# Patient Record
Sex: Female | Born: 1985 | Race: White | Hispanic: No | Marital: Married | Smoking: Never smoker
Health system: Southern US, Community
[De-identification: ages and names within clinical notes are randomized; demographics above are authoritative.]

## PROBLEM LIST (undated history)

## (undated) DIAGNOSIS — F32A Depression, unspecified: Secondary | ICD-10-CM

## (undated) DIAGNOSIS — F419 Anxiety disorder, unspecified: Secondary | ICD-10-CM

## (undated) DIAGNOSIS — D649 Anemia, unspecified: Secondary | ICD-10-CM

## (undated) DIAGNOSIS — R87629 Unspecified abnormal cytological findings in specimens from vagina: Secondary | ICD-10-CM

## (undated) DIAGNOSIS — E282 Polycystic ovarian syndrome: Secondary | ICD-10-CM

## (undated) DIAGNOSIS — N83209 Unspecified ovarian cyst, unspecified side: Secondary | ICD-10-CM

## (undated) DIAGNOSIS — N760 Acute vaginitis: Secondary | ICD-10-CM

## (undated) HISTORY — DX: Polycystic ovarian syndrome: E28.2

## (undated) HISTORY — DX: Unspecified abnormal cytological findings in specimens from vagina: R87.629

## (undated) HISTORY — PX: WISDOM TOOTH EXTRACTION: SHX21

## (undated) HISTORY — DX: Unspecified ovarian cyst, unspecified side: N83.209

---

## 2009-08-13 ENCOUNTER — Emergency Department (HOSPITAL_COMMUNITY): Admission: EM | Admit: 2009-08-13 | Discharge: 2009-08-13 | Payer: Self-pay | Admitting: Emergency Medicine

## 2009-12-23 ENCOUNTER — Emergency Department (HOSPITAL_COMMUNITY): Admission: EM | Admit: 2009-12-23 | Discharge: 2009-12-23 | Payer: Self-pay | Admitting: Emergency Medicine

## 2010-09-18 LAB — URINALYSIS, ROUTINE W REFLEX MICROSCOPIC
Glucose, UA: NEGATIVE mg/dL
Leukocytes, UA: NEGATIVE
Nitrite: NEGATIVE
Protein, ur: NEGATIVE mg/dL
Urobilinogen, UA: 0.2 mg/dL (ref 0.0–1.0)

## 2010-09-18 LAB — CBC
Hemoglobin: 12.8 g/dL (ref 12.0–15.0)
MCH: 31 pg (ref 26.0–34.0)
MCV: 91.6 fL (ref 78.0–100.0)
Platelets: 186 10*3/uL (ref 150–400)

## 2010-09-18 LAB — WET PREP, GENITAL
Trich, Wet Prep: NONE SEEN
Yeast Wet Prep HPF POC: NONE SEEN

## 2010-09-18 LAB — POCT PREGNANCY, URINE: Preg Test, Ur: NEGATIVE

## 2010-09-18 LAB — RPR: RPR Ser Ql: NONREACTIVE

## 2010-09-18 LAB — COMPREHENSIVE METABOLIC PANEL
AST: 18 U/L (ref 0–37)
Albumin: 3.5 g/dL (ref 3.5–5.2)
Alkaline Phosphatase: 62 U/L (ref 39–117)
BUN: 6 mg/dL (ref 6–23)
Calcium: 9 mg/dL (ref 8.4–10.5)
Creatinine, Ser: 0.8 mg/dL (ref 0.4–1.2)
GFR calc non Af Amer: >60 mL/min (ref >60)
Glucose, Bld: 92 mg/dL (ref 70–99)
Potassium: 3.7 mEq/L (ref 3.5–5.1)

## 2010-09-18 LAB — DIFFERENTIAL
Basophils Relative: 0 % (ref 0–1)
Eosinophils Absolute: 0 10*3/uL (ref 0.0–0.7)
Lymphocytes Relative: 16 % (ref 12–46)
Lymphs Abs: 1.1 10*3/uL (ref 0.7–4.0)
Monocytes Absolute: 0.5 10*3/uL (ref 0.1–1.0)
Monocytes Relative: 7 % (ref 3–12)
Neutrophils Relative %: 78 % — ABNORMAL HIGH (ref 43–77)

## 2010-09-18 LAB — URINE CULTURE

## 2010-09-18 LAB — URINE MICROSCOPIC-ADD ON

## 2010-09-18 LAB — GC/CHLAMYDIA PROBE AMP, GENITAL: Chlamydia, DNA Probe: NEGATIVE

## 2011-04-02 ENCOUNTER — Encounter (HOSPITAL_COMMUNITY): Payer: Self-pay | Admitting: *Deleted

## 2011-04-02 ENCOUNTER — Inpatient Hospital Stay (HOSPITAL_COMMUNITY)
Admission: AD | Admit: 2011-04-02 | Discharge: 2011-04-02 | Disposition: A | Discharge status: Home / Self Care | Payer: PRIVATE HEALTH INSURANCE | Source: Ambulatory Visit | Attending: Obstetrics and Gynecology | Admitting: Obstetrics and Gynecology

## 2011-04-02 DIAGNOSIS — R300 Dysuria: Secondary | ICD-10-CM | POA: Insufficient documentation

## 2011-04-02 DIAGNOSIS — R319 Hematuria, unspecified: Secondary | ICD-10-CM | POA: Insufficient documentation

## 2011-04-02 HISTORY — DX: Acute vaginitis: N76.0

## 2011-04-02 LAB — URINALYSIS, ROUTINE W REFLEX MICROSCOPIC
Glucose, UA: NEGATIVE mg/dL
Ketones, ur: NEGATIVE mg/dL
Nitrite: POSITIVE — AB
Protein, ur: 100 mg/dL — AB
Specific Gravity, Urine: 1.02 (ref 1.005–1.030)
Urobilinogen, UA: 0.2 mg/dL (ref 0.0–1.0)
pH: 6.5 (ref 5.0–8.0)

## 2011-04-02 LAB — URINE MICROSCOPIC-ADD ON

## 2011-04-02 MED ORDER — CIPROFLOXACIN HCL 250 MG PO TABS: 250.0000 mg | ORAL_TABLET | Freq: Two times a day (BID) | ORAL | Status: AC

## 2011-04-02 MED ORDER — PHENAZOPYRIDINE HCL 200 MG PO TABS: 200.0000 mg | ORAL_TABLET | Freq: Three times a day (TID) | ORAL | Status: AC

## 2011-04-02 NOTE — Progress Notes (Signed)
Dr. Billy Coast at bedside.  Assessment done and poc discussed with pt.

## 2011-04-02 NOTE — Progress Notes (Signed)
Pt presents to [facility] for c/o  Blood in urine.  Started this afternoon and became worse around 7pm.  C/o vaginal pain and pain in bladder with burning during urination.

## 2011-04-02 NOTE — Progress Notes (Signed)
Dr. Billy Coast notified of pt. Presenting for blood in urine.  States pt was supposed to go to lab for urinalysis and not sign in to [facility].  Will notify charge nurse.  MD states he will not be able to see pt for a couple of hours.

## 2011-04-02 NOTE — ED Provider Notes (Signed)
History   Dysuria  Chief Complaint  Patient presents with  . Hematuria   HPI25 yo wf non pregnant with dysuria. No fever , chills, nausea or vomiting. No vaginal bleeding. No BC. No flank pain. No history of urolithiasis.  Pertinent Gynecological History: Menses: flow is moderate, irregular occurring approximately every 28 days with spotting approximately 0 days per month, usually lasting less than 6 days and with minimal cramping Bleeding: normal Contraception: none DES exposure: denies Blood transfusions: none Sexually transmitted diseases: no past history Previous GYN Procedures: none  Last mammogram: na Date: na Last pap: normal Date: na   Past Medical History  Diagnosis Date  . Vaginal vestibulitis     Past Surgical History  Procedure Date  . Wisdom tooth extraction     No family history on file.  History  Substance Use Topics  . Smoking status: Never Smoker   . Smokeless tobacco: Not on file  . Alcohol Use: Yes    Allergies: No Known Allergies  Prescriptions prior to admission  Medication Sig Dispense Refill  . amitriptyline (ELAVIL) 10 MG tablet Take 10 mg by mouth at bedtime.        Marland Kitchen ibuprofen (ADVIL,MOTRIN) 200 MG tablet Take 200 mg by mouth every 6 (six) hours as needed. For pain         ROS Otherwise negative Physical Exam WDWN WF NAD HEENT-nl Neck- supple Chest: CTA CV: RRR ABD: soft NT No CVAT or flank pain Pelvic ; deferred Ext: Neg C/c/e Neuro: nonfocal Skin: intact   Blood pressure 126/83, pulse 96, temperature 98.2 F (36.8 C), temperature source Oral, resp. rate 20, height 5\' 4"  (1.626 m), weight 74.9 kg (165 lb 2 oz), last menstrual period 02/03/2011.  Physical Exam  MAU Course  Procedures  MDM na  Assessment and Plan  Dysuria with pos UA  Cipro 250 mg bid x 7d Pyridium 200mg  tid x 2d Pyelo precautions.  Jyren Cerasoli J 04/02/2011, 10:25 PM [facility]

## 2011-04-02 NOTE — ED Provider Notes (Signed)
Rachel [name] is a 25 y.o. female who presents to [facility]U for blood in urine. Symptoms started at 3 pm and have gotten worse.  Abdomen is soft with suprapubic tenderness. No CVA tenderness. Patient is afebrile and stable to await Dr. Jorene Minors arrival.  Kerrie Buffalo, NP 04/02/11 2205

## 2015-06-02 LAB — OB RESULTS CONSOLE ANTIBODY SCREEN: Antibody Screen: NEGATIVE

## 2015-06-02 LAB — OB RESULTS CONSOLE HIV ANTIBODY (ROUTINE TESTING): HIV: NONREACTIVE

## 2015-06-02 LAB — OB RESULTS CONSOLE GC/CHLAMYDIA
Chlamydia: NEGATIVE
Gonorrhea: NEGATIVE

## 2015-06-02 LAB — OB RESULTS CONSOLE RUBELLA ANTIBODY, IGM: Rubella: IMMUNE

## 2015-06-02 LAB — OB RESULTS CONSOLE HEPATITIS B SURFACE ANTIGEN: Hepatitis B Surface Ag: NEGATIVE

## 2015-06-02 LAB — OB RESULTS CONSOLE ABO/RH: RH TYPE: POSITIVE

## 2015-06-02 LAB — OB RESULTS CONSOLE RPR: RPR: NONREACTIVE

## 2015-12-09 LAB — OB RESULTS CONSOLE GBS: STREP GROUP B AG: NEGATIVE

## 2016-01-08 ENCOUNTER — Inpatient Hospital Stay (HOSPITAL_COMMUNITY)
Admission: AD | Admit: 2016-01-08 | Payer: PRIVATE HEALTH INSURANCE | Source: Ambulatory Visit | Admitting: Obstetrics and Gynecology

## 2016-01-11 ENCOUNTER — Encounter (HOSPITAL_COMMUNITY): Payer: Self-pay | Admitting: *Deleted

## 2016-01-11 ENCOUNTER — Telehealth (HOSPITAL_COMMUNITY): Payer: Self-pay | Admitting: *Deleted

## 2016-01-11 NOTE — Telephone Encounter (Signed)
Preadmission screen  

## 2016-01-12 ENCOUNTER — Other Ambulatory Visit: Payer: Self-pay | Admitting: Obstetrics & Gynecology

## 2016-01-17 ENCOUNTER — Inpatient Hospital Stay (HOSPITAL_COMMUNITY)
Admission: RE | Admit: 2016-01-17 | Discharge: 2016-01-20 | DRG: 765 | Disposition: A | Discharge status: Home / Self Care | Payer: Managed Care, Other (non HMO) | Source: Ambulatory Visit | Attending: Obstetrics & Gynecology | Admitting: Obstetrics & Gynecology

## 2016-01-17 ENCOUNTER — Inpatient Hospital Stay (HOSPITAL_COMMUNITY): Payer: Managed Care, Other (non HMO) | Admitting: Anesthesiology

## 2016-01-17 ENCOUNTER — Inpatient Hospital Stay (HOSPITAL_COMMUNITY): Admission: RE | Admit: 2016-01-17 | Payer: PRIVATE HEALTH INSURANCE | Source: Ambulatory Visit

## 2016-01-17 DIAGNOSIS — Z3490 Encounter for supervision of normal pregnancy, unspecified, unspecified trimester: Secondary | ICD-10-CM

## 2016-01-17 DIAGNOSIS — Z3A41 41 weeks gestation of pregnancy: Secondary | ICD-10-CM

## 2016-01-17 DIAGNOSIS — Z9889 Other specified postprocedural states: Secondary | ICD-10-CM

## 2016-01-17 DIAGNOSIS — D62 Acute posthemorrhagic anemia: Secondary | ICD-10-CM | POA: Diagnosis not present

## 2016-01-17 DIAGNOSIS — O9081 Anemia of the puerperium: Secondary | ICD-10-CM | POA: Diagnosis not present

## 2016-01-17 DIAGNOSIS — O480 Post-term pregnancy: Secondary | ICD-10-CM | POA: Diagnosis present

## 2016-01-17 LAB — CBC
HEMATOCRIT: 32 % — AB (ref 36.0–46.0)
HEMOGLOBIN: 11.2 g/dL — AB (ref 12.0–15.0)
MCH: 31.5 pg (ref 26.0–34.0)
MCHC: 35 g/dL (ref 30.0–36.0)
MCV: 89.9 fL (ref 78.0–100.0)
PLATELETS: 152 10*3/uL (ref 150–400)
RBC: 3.56 MIL/uL — AB (ref 3.87–5.11)
RDW: 13.5 % (ref 11.5–15.5)
WBC: 8.3 10*3/uL (ref 4.0–10.5)

## 2016-01-17 LAB — TYPE AND SCREEN
ABO/RH(D): B POS
Antibody Screen: NEGATIVE

## 2016-01-17 LAB — ABO/RH: ABO/RH(D): B POS

## 2016-01-17 MED ORDER — TERBUTALINE SULFATE 1 MG/ML IJ SOLN: 0.2500 mg | Freq: Once | INTRAMUSCULAR | Status: DC | PRN

## 2016-01-17 MED ORDER — EPHEDRINE 5 MG/ML INJ: 10.0000 mg | INTRAVENOUS | Status: DC | PRN

## 2016-01-17 MED ORDER — LACTATED RINGERS IV SOLN: 500.0000 mL | INTRAVENOUS | Status: DC | PRN

## 2016-01-17 MED ORDER — LACTATED RINGERS IV SOLN: 500.0000 mL | Freq: Once | INTRAVENOUS | Status: DC

## 2016-01-17 MED ORDER — SOD CITRATE-CITRIC ACID 500-334 MG/5ML PO SOLN
30.0000 mL | ORAL | Status: DC | PRN
  Administered 2016-01-18: 30 mL via ORAL
  Filled 2016-01-17: qty 15

## 2016-01-17 MED ORDER — FLEET ENEMA 7-19 GM/118ML RE ENEM: 1.0000 | ENEMA | RECTAL | Status: DC | PRN

## 2016-01-17 MED ORDER — LACTATED RINGERS IV SOLN
INTRAVENOUS | Status: DC
  Administered 2016-01-17 (×3): via INTRAVENOUS

## 2016-01-17 MED ORDER — ONDANSETRON HCL 4 MG/2ML IJ SOLN
4.0000 mg | Freq: Four times a day (QID) | INTRAMUSCULAR | Status: DC | PRN
  Administered 2016-01-17: 4 mg via INTRAVENOUS
  Filled 2016-01-17: qty 2

## 2016-01-17 MED ORDER — PHENYLEPHRINE 40 MCG/ML (10ML) SYRINGE FOR IV PUSH (FOR BLOOD PRESSURE SUPPORT)
80.0000 ug | PREFILLED_SYRINGE | INTRAVENOUS | Status: DC | PRN
  Filled 2016-01-17: qty 10

## 2016-01-17 MED ORDER — FENTANYL 2.5 MCG/ML BUPIVACAINE 1/10 % EPIDURAL INFUSION (WH - ANES)
14.0000 mL/h | INTRAMUSCULAR | Status: DC | PRN
  Administered 2016-01-17 (×2): 14 mL/h via EPIDURAL
  Filled 2016-01-17 (×3): qty 125

## 2016-01-17 MED ORDER — OXYTOCIN 40 UNITS IN LACTATED RINGERS INFUSION - SIMPLE MED
1.0000 m[IU]/min | INTRAVENOUS | Status: DC
  Administered 2016-01-17: 2 m[IU]/min via INTRAVENOUS

## 2016-01-17 MED ORDER — MISOPROSTOL 25 MCG QUARTER TABLET
25.0000 ug | ORAL_TABLET | ORAL | Status: DC | PRN
  Administered 2016-01-17 (×2): 25 ug via VAGINAL
  Filled 2016-01-17 (×3): qty 0.25

## 2016-01-17 MED ORDER — OXYCODONE-ACETAMINOPHEN 5-325 MG PO TABS: 1.0000 | ORAL_TABLET | ORAL | Status: DC | PRN

## 2016-01-17 MED ORDER — LIDOCAINE HCL (PF) 1 % IJ SOLN: 30.0000 mL | INTRAMUSCULAR | Status: DC | PRN

## 2016-01-17 MED ORDER — OXYTOCIN BOLUS FROM INFUSION: 500.0000 mL | INTRAVENOUS | Status: DC

## 2016-01-17 MED ORDER — OXYTOCIN 40 UNITS IN LACTATED RINGERS INFUSION - SIMPLE MED
2.5000 [IU]/h | INTRAVENOUS | Status: DC
  Filled 2016-01-17: qty 1000

## 2016-01-17 MED ORDER — DIPHENHYDRAMINE HCL 50 MG/ML IJ SOLN: 12.5000 mg | INTRAMUSCULAR | Status: DC | PRN

## 2016-01-17 MED ORDER — OXYCODONE-ACETAMINOPHEN 5-325 MG PO TABS: 2.0000 | ORAL_TABLET | ORAL | Status: DC | PRN

## 2016-01-17 MED ORDER — PHENYLEPHRINE 40 MCG/ML (10ML) SYRINGE FOR IV PUSH (FOR BLOOD PRESSURE SUPPORT)
80.0000 ug | PREFILLED_SYRINGE | INTRAVENOUS | Status: DC | PRN
  Administered 2016-01-17: 80 ug via INTRAVENOUS

## 2016-01-17 MED ORDER — LIDOCAINE HCL (PF) 1 % IJ SOLN
INTRAMUSCULAR | Status: DC | PRN
  Administered 2016-01-17: 6 mL via EPIDURAL
  Administered 2016-01-17: 4 mL

## 2016-01-17 MED ORDER — ACETAMINOPHEN 325 MG PO TABS: 650.0000 mg | ORAL_TABLET | ORAL | Status: DC | PRN

## 2016-01-17 NOTE — Anesthesia Procedure Notes (Signed)

## 2016-01-17 NOTE — Anesthesia Pain Management Evaluation Note (Signed)
  CRNA Pain Management Visit Note  Patient: Rachel [name], 30 y.o., female  "Hello I am a member of the anesthesia team at [facility]. We have an anesthesia team available at all times to provide care throughout the hospital, including epidural management and anesthesia for C-section. I don't know your plan for the delivery whether it a natural birth, water birth, IV sedation, nitrous supplementation, doula or epidural, but we want to meet your pain goals."   1.Was your pain managed to your expectations on prior hospitalizations?   No prior hospitalizations  2.What is your expectation for pain management during this hospitalization?     Epidural  3.How can we help you reach that goal? epidural  Record the patient's initial score and the patient's pain goal.   Pain: 3  Pain Goal: 5 [facility] wants you to be able to say your pain was always managed very well.  Daanish Copes 01/17/2016

## 2016-01-17 NOTE — H&P (Signed)
Rachel [name] is a 30 y.o. female G2P0010 581w2d presenting for Induction Postdates.  HPI/HPP:  Normal pregnancy, but post dates.  Induction Cytotec/Pitocin.  Good FMs, no AF leak, no reg UCs, no vaginal bleeding, no PEC Sx.  GBS neg.  OB History    Gravida Para Term Preterm AB TAB SAB Ectopic Multiple Living   2    1  1         Past Medical History  Diagnosis Date  . Vaginal vestibulitis   . Vaginal Pap smear, abnormal   . PCOS (polycystic ovarian syndrome)    Past Surgical History  Procedure Laterality Date  . Wisdom tooth extraction     Family History: family history includes Diabetes in her paternal grandmother; Hypertension in her father. Social History:  reports that she has never smoked. She has never used smokeless tobacco. She reports that she drinks alcohol. She reports that she does not use illicit drugs.  No Known Allergies  Dilation: 4 Effacement (%): 90 Station: -2 Exam by:: m wilkins rnc   Blood pressure 136/83, pulse 98, temperature 98.7 F (37.1 C), temperature source Oral, resp. rate 20, height 5\' 4"  (1.626 m), weight 207 lb (93.895 kg), SpO2 100 %. Exam Physical Exam   FHR monitoring Base line 120's with good variability and accelerations.  No deceleration.  HPP:  Patient Active Problem List   Diagnosis Date Noted  . Pregnancy 01/17/2016    Prenatal labs: ABO, Rh: --/--/B POS (07/17 0055) Antibody: NEG (07/17 0050) Rubella: Immune RPR: Nonreactive (11/30 0000)  HBsAg: Negative (11/30 0000)  HIV: Non-reactive (11/30 0000)  Genetic testing: Declined.  AFP1 neg US anato: wnl, normal placenta.  Female fetus. 1 hr GTT: Abnormal.  3 hr GTT wnl GBS: Negative (06/08 0000)   Assessment/Plan: 41 2/7 wks for Induction postdates.  Cytotec, Pitocin.  Entering active labor.  FHR cat 1.  Well under epidural.   Jeromie Gainor,MARIE-LYNE 01/17/2016, 4:04 PM

## 2016-01-17 NOTE — Anesthesia Preprocedure Evaluation (Addendum)
Anesthesia Evaluation  Patient identified by MRN, date of birth, ID band Patient awake    Reviewed: Allergy & Precautions, H&P , Patient's Chart, lab work & pertinent test results  Airway Mallampati: II  TM Distance: >3 FB Neck ROM: full    Dental  (+) Teeth Intact   Pulmonary neg pulmonary ROS,    breath sounds clear to auscultation       Cardiovascular negative cardio ROS   Rhythm:regular Rate:Normal     Neuro/Psych negative neurological ROS  negative psych ROS   GI/Hepatic negative GI ROS, Neg liver ROS,   Endo/Other  negative endocrine ROS  Renal/GU negative Renal ROS  negative genitourinary   Musculoskeletal negative musculoskeletal ROS (+)   Abdominal   Peds negative pediatric ROS (+)  Hematology negative hematology ROS (+)   Anesthesia Other Findings       Reproductive/Obstetrics (+) Pregnancy                            Lab Results  Component Value Date   WBC 8.3 01/17/2016   HGB 11.2* 01/17/2016   HCT 32.0* 01/17/2016   MCV 89.9 01/17/2016   PLT 152 01/17/2016     Anesthesia Physical Anesthesia Plan  ASA: II  Anesthesia Plan: Epidural   Post-op Pain Management:    Induction:   Airway Management Planned:   Additional Equipment:   Intra-op Plan:   Post-operative Plan:   Informed Consent: I have reviewed the patients History and Physical, chart, labs and discussed the procedure including the risks, benefits and alternatives for the proposed anesthesia with the patient or authorized representative who has indicated his/her understanding and acceptance.   Dental Advisory Given  Plan Discussed with:   Anesthesia Plan Comments: (Labs checked- platelets confirmed with RN in room. Fetal heart tracing, per RN, reported to be stable enough for sitting procedure. Discussed epidural, and patient consents to the procedure:  included risk of possible  headache,backache, failed block, allergic reaction, and nerve injury. This patient was asked if she had any questions or concerns before the procedure started.)        Anesthesia Quick Evaluation

## 2016-01-18 ENCOUNTER — Encounter (HOSPITAL_COMMUNITY)
Admission: RE | Disposition: A | Discharge status: Home / Self Care | Payer: Self-pay | Source: Ambulatory Visit | Attending: Obstetrics & Gynecology

## 2016-01-18 ENCOUNTER — Encounter (HOSPITAL_COMMUNITY): Payer: Self-pay

## 2016-01-18 DIAGNOSIS — Z9889 Other specified postprocedural states: Secondary | ICD-10-CM

## 2016-01-18 LAB — CBC
HEMATOCRIT: 27.7 % — AB (ref 36.0–46.0)
HEMOGLOBIN: 9.5 g/dL — AB (ref 12.0–15.0)
MCH: 31.1 pg (ref 26.0–34.0)
MCHC: 34.3 g/dL (ref 30.0–36.0)
MCV: 90.8 fL (ref 78.0–100.0)
Platelets: 126 10*3/uL — ABNORMAL LOW (ref 150–400)
RBC: 3.05 MIL/uL — AB (ref 3.87–5.11)
RDW: 13.7 % (ref 11.5–15.5)
WBC: 17.8 10*3/uL — ABNORMAL HIGH (ref 4.0–10.5)

## 2016-01-18 LAB — RPR: RPR Ser Ql: NONREACTIVE

## 2016-01-18 SURGERY — Surgical Case
Anesthesia: Epidural

## 2016-01-18 MED ORDER — LIDOCAINE-EPINEPHRINE (PF) 2 %-1:200000 IJ SOLN
INTRAMUSCULAR | Status: AC
  Filled 2016-01-18: qty 20

## 2016-01-18 MED ORDER — NALOXONE HCL 2 MG/2ML IJ SOSY
1.0000 ug/kg/h | PREFILLED_SYRINGE | INTRAVENOUS | Status: DC | PRN
  Filled 2016-01-18: qty 2

## 2016-01-18 MED ORDER — KETOROLAC TROMETHAMINE 30 MG/ML IJ SOLN
INTRAMUSCULAR | Status: DC | PRN
  Administered 2016-01-18: 30 mg via INTRAVENOUS

## 2016-01-18 MED ORDER — SIMETHICONE 80 MG PO CHEW
80.0000 mg | CHEWABLE_TABLET | ORAL | Status: DC
  Administered 2016-01-18 – 2016-01-19 (×2): 80 mg via ORAL
  Filled 2016-01-18 (×2): qty 1

## 2016-01-18 MED ORDER — MEPERIDINE HCL 25 MG/ML IJ SOLN: 6.2500 mg | INTRAMUSCULAR | Status: DC | PRN

## 2016-01-18 MED ORDER — DIPHENHYDRAMINE HCL 50 MG/ML IJ SOLN: 12.5000 mg | INTRAMUSCULAR | Status: DC | PRN

## 2016-01-18 MED ORDER — COCONUT OIL OIL: 1.0000 "application " | TOPICAL_OIL | Status: DC | PRN

## 2016-01-18 MED ORDER — IBUPROFEN 600 MG PO TABS
600.0000 mg | ORAL_TABLET | Freq: Four times a day (QID) | ORAL | Status: DC
  Administered 2016-01-18 – 2016-01-20 (×9): 600 mg via ORAL
  Filled 2016-01-18 (×9): qty 1

## 2016-01-18 MED ORDER — TETANUS-DIPHTH-ACELL PERTUSSIS 5-2.5-18.5 LF-MCG/0.5 IM SUSP: 0.5000 mL | Freq: Once | INTRAMUSCULAR | Status: DC

## 2016-01-18 MED ORDER — LACTATED RINGERS IV SOLN
INTRAVENOUS | Status: DC | PRN
  Administered 2016-01-18: 03:00:00 via INTRAVENOUS

## 2016-01-18 MED ORDER — PRENATAL MULTIVITAMIN CH
1.0000 | ORAL_TABLET | Freq: Every day | ORAL | Status: DC
  Administered 2016-01-20: 1 via ORAL
  Filled 2016-01-18 (×2): qty 1

## 2016-01-18 MED ORDER — OXYTOCIN 40 UNITS IN LACTATED RINGERS INFUSION - SIMPLE MED: 2.5000 [IU]/h | INTRAVENOUS | Status: AC

## 2016-01-18 MED ORDER — LACTATED RINGERS IV SOLN
INTRAVENOUS | Status: DC | PRN
  Administered 2016-01-18 (×2): via INTRAVENOUS

## 2016-01-18 MED ORDER — FENTANYL CITRATE (PF) 100 MCG/2ML IJ SOLN
25.0000 ug | INTRAMUSCULAR | Status: DC | PRN
Start: 2016-01-18 — End: 2016-01-18

## 2016-01-18 MED ORDER — DIPHENHYDRAMINE HCL 25 MG PO CAPS: 25.0000 mg | ORAL_CAPSULE | Freq: Four times a day (QID) | ORAL | Status: DC | PRN

## 2016-01-18 MED ORDER — NALBUPHINE HCL 10 MG/ML IJ SOLN: 5.0000 mg | INTRAMUSCULAR | Status: DC | PRN

## 2016-01-18 MED ORDER — NALBUPHINE HCL 10 MG/ML IJ SOLN: 5.0000 mg | Freq: Once | INTRAMUSCULAR | Status: DC | PRN

## 2016-01-18 MED ORDER — DIPHENHYDRAMINE HCL 25 MG PO CAPS
25.0000 mg | ORAL_CAPSULE | ORAL | Status: DC | PRN
  Filled 2016-01-18: qty 1

## 2016-01-18 MED ORDER — MORPHINE SULFATE (PF) 0.5 MG/ML IJ SOLN
INTRAMUSCULAR | Status: AC
  Filled 2016-01-18: qty 10

## 2016-01-18 MED ORDER — SENNOSIDES-DOCUSATE SODIUM 8.6-50 MG PO TABS
2.0000 | ORAL_TABLET | ORAL | Status: DC
  Administered 2016-01-18 – 2016-01-19 (×2): 2 via ORAL
  Filled 2016-01-18 (×2): qty 2

## 2016-01-18 MED ORDER — SODIUM BICARBONATE 8.4 % IV SOLN
INTRAVENOUS | Status: DC | PRN
  Administered 2016-01-18 (×3): 5 mL via EPIDURAL

## 2016-01-18 MED ORDER — WITCH HAZEL-GLYCERIN EX PADS: 1.0000 "application " | MEDICATED_PAD | CUTANEOUS | Status: DC | PRN

## 2016-01-18 MED ORDER — SIMETHICONE 80 MG PO CHEW: 80.0000 mg | CHEWABLE_TABLET | ORAL | Status: DC | PRN

## 2016-01-18 MED ORDER — ONDANSETRON HCL 4 MG/2ML IJ SOLN
4.0000 mg | Freq: Three times a day (TID) | INTRAMUSCULAR | Status: DC | PRN
Start: 2016-01-18 — End: 2016-01-20

## 2016-01-18 MED ORDER — DEXAMETHASONE SODIUM PHOSPHATE 4 MG/ML IJ SOLN
INTRAMUSCULAR | Status: DC | PRN
  Administered 2016-01-18: 4 mg via INTRAVENOUS

## 2016-01-18 MED ORDER — OXYTOCIN 10 UNIT/ML IJ SOLN
INTRAMUSCULAR | Status: AC
  Filled 2016-01-18: qty 4

## 2016-01-18 MED ORDER — OXYCODONE HCL 5 MG PO TABS
10.0000 mg | ORAL_TABLET | Freq: Four times a day (QID) | ORAL | Status: DC | PRN
  Administered 2016-01-19 (×2): 10 mg via ORAL
  Filled 2016-01-18 (×4): qty 2

## 2016-01-18 MED ORDER — SCOPOLAMINE 1 MG/3DAYS TD PT72: 1.0000 | MEDICATED_PATCH | Freq: Once | TRANSDERMAL | Status: DC

## 2016-01-18 MED ORDER — KETOROLAC TROMETHAMINE 30 MG/ML IJ SOLN: 30.0000 mg | Freq: Four times a day (QID) | INTRAMUSCULAR | Status: AC | PRN

## 2016-01-18 MED ORDER — CEFAZOLIN SODIUM-DEXTROSE 2-3 GM-% IV SOLR
INTRAVENOUS | Status: DC | PRN
  Administered 2016-01-18: 2 g via INTRAVENOUS

## 2016-01-18 MED ORDER — NALOXONE HCL 0.4 MG/ML IJ SOLN: 0.4000 mg | INTRAMUSCULAR | Status: DC | PRN

## 2016-01-18 MED ORDER — DEXTROSE 5 % IV SOLN
INTRAVENOUS | Status: AC
  Filled 2016-01-18: qty 3000

## 2016-01-18 MED ORDER — MORPHINE SULFATE (PF) 0.5 MG/ML IJ SOLN
INTRAMUSCULAR | Status: DC | PRN
  Administered 2016-01-18: 1 mg via INTRAVENOUS
  Administered 2016-01-18: 4 mg via EPIDURAL

## 2016-01-18 MED ORDER — MENTHOL 3 MG MT LOZG: 1.0000 | LOZENGE | OROMUCOSAL | Status: DC | PRN

## 2016-01-18 MED ORDER — ZOLPIDEM TARTRATE 5 MG PO TABS: 5.0000 mg | ORAL_TABLET | Freq: Every evening | ORAL | Status: DC | PRN

## 2016-01-18 MED ORDER — CEFAZOLIN SODIUM-DEXTROSE 2-4 GM/100ML-% IV SOLN
INTRAVENOUS | Status: AC
  Filled 2016-01-18: qty 100

## 2016-01-18 MED ORDER — DEXAMETHASONE SODIUM PHOSPHATE 4 MG/ML IJ SOLN
INTRAMUSCULAR | Status: AC
  Filled 2016-01-18: qty 1

## 2016-01-18 MED ORDER — FERROUS SULFATE 325 (65 FE) MG PO TABS
325.0000 mg | ORAL_TABLET | Freq: Two times a day (BID) | ORAL | Status: DC
Start: 2016-01-18 — End: 2016-01-20
  Administered 2016-01-18 – 2016-01-20 (×5): 325 mg via ORAL
  Filled 2016-01-18 (×5): qty 1

## 2016-01-18 MED ORDER — BUPIVACAINE HCL 0.25 % IJ SOLN
INTRAMUSCULAR | Status: DC | PRN
  Administered 2016-01-18: 20 mL

## 2016-01-18 MED ORDER — PROMETHAZINE HCL 25 MG/ML IJ SOLN: 6.2500 mg | INTRAMUSCULAR | Status: DC | PRN

## 2016-01-18 MED ORDER — OXYCODONE HCL 5 MG PO TABS
5.0000 mg | ORAL_TABLET | ORAL | Status: DC | PRN
  Administered 2016-01-18 (×2): 5 mg via ORAL
  Filled 2016-01-18: qty 1

## 2016-01-18 MED ORDER — KETOROLAC TROMETHAMINE 30 MG/ML IJ SOLN
INTRAMUSCULAR | Status: AC
  Filled 2016-01-18: qty 1

## 2016-01-18 MED ORDER — LACTATED RINGERS IV SOLN
40.0000 [IU] | INTRAVENOUS | Status: DC | PRN
  Administered 2016-01-18: 40 [IU] via INTRAVENOUS

## 2016-01-18 MED ORDER — SODIUM CHLORIDE 0.9% FLUSH: 3.0000 mL | INTRAVENOUS | Status: DC | PRN

## 2016-01-18 MED ORDER — SCOPOLAMINE 1 MG/3DAYS TD PT72SCOPOLAMINE 1 MG/3DAYS
MEDICATED_PATCH | TRANSDERMAL | Status: DC | PRN
Start: 2016-01-18 — End: 2016-01-18
  Administered 2016-01-18: 1 via TRANSDERMAL

## 2016-01-18 MED ORDER — SODIUM BICARBONATE 8.4 % IV SOLN
INTRAVENOUS | Status: AC
  Filled 2016-01-18: qty 50

## 2016-01-18 MED ORDER — BUPIVACAINE HCL (PF) 0.25 % IJ SOLN
INTRAMUSCULAR | Status: AC
  Filled 2016-01-18: qty 20

## 2016-01-18 MED ORDER — SODIUM CHLORIDE 0.9 % IR SOLN
Status: DC | PRN
  Administered 2016-01-18: 1000 mL

## 2016-01-18 MED ORDER — ACETAMINOPHEN 325 MG PO TABS
650.0000 mg | ORAL_TABLET | ORAL | Status: DC | PRN
  Administered 2016-01-18 – 2016-01-19 (×4): 650 mg via ORAL
  Filled 2016-01-18 (×4): qty 2

## 2016-01-18 MED ORDER — DIBUCAINE 1 % RE OINT: 1.0000 "application " | TOPICAL_OINTMENT | RECTAL | Status: DC | PRN

## 2016-01-18 MED ORDER — LACTATED RINGERS IV SOLN: INTRAVENOUS | Status: DC

## 2016-01-18 MED ORDER — MAGNESIUM HYDROXIDE 400 MG/5ML PO SUSP: 30.0000 mL | ORAL | Status: DC | PRN

## 2016-01-18 MED ORDER — SIMETHICONE 80 MG PO CHEW
80.0000 mg | CHEWABLE_TABLET | Freq: Three times a day (TID) | ORAL | Status: DC
  Administered 2016-01-18 – 2016-01-20 (×7): 80 mg via ORAL
  Filled 2016-01-18 (×7): qty 1

## 2016-01-18 MED ORDER — ONDANSETRON HCL 4 MG/2ML IJ SOLN
INTRAMUSCULAR | Status: DC | PRN
  Administered 2016-01-18: 4 mg via INTRAVENOUS

## 2016-01-18 MED ORDER — ONDANSETRON HCL 4 MG/2ML IJ SOLN
INTRAMUSCULAR | Status: AC
  Filled 2016-01-18: qty 2

## 2016-01-18 SURGICAL SUPPLY — 40 items
CHLORAPREP W/TINT 26ML (MISCELLANEOUS) ×2 IMPLANT
CLAMP CORD UMBIL (MISCELLANEOUS) IMPLANT
CLOTH BEACON ORANGE TIMEOUT ST (SAFETY) ×2 IMPLANT
CONTAINER PREFILL 10% NBF 15ML (MISCELLANEOUS) IMPLANT
DRSG OPSITE POSTOP 4X10 (GAUZE/BANDAGES/DRESSINGS) ×2 IMPLANT
ELECT REM PT RETURN 9FT ADLT (ELECTROSURGICAL) ×2
ELECTRODE REM PT RTRN 9FT ADLT (ELECTROSURGICAL) ×1 IMPLANT
EXTRACTOR VACUUM M CUP 4 TUBE (SUCTIONS) IMPLANT
GLOVE BIO SURGEON STRL SZ 6.5 (GLOVE) ×2 IMPLANT
GLOVE BIOGEL PI IND STRL 7.0 (GLOVE) ×2 IMPLANT
GLOVE BIOGEL PI INDICATOR 7.0 (GLOVE) ×2
GOWN STRL REUS W/TWL LRG LVL3 (GOWN DISPOSABLE) ×4 IMPLANT
KIT ABG SYR 3ML LUER SLIP (SYRINGE) IMPLANT
LIQUID BAND (GAUZE/BANDAGES/DRESSINGS) ×2 IMPLANT
NEEDLE HYPO 22GX1.5 SAFETY (NEEDLE) ×2 IMPLANT
NEEDLE HYPO 25X5/8 SAFETYGLIDE (NEEDLE) IMPLANT
PACK C SECTION WH (CUSTOM PROCEDURE TRAY) ×2 IMPLANT
PAD OB MATERNITY 4.3X12.25 (PERSONAL CARE ITEMS) ×2 IMPLANT
RTRCTR C-SECT PINK 25CM LRG (MISCELLANEOUS) ×2 IMPLANT
SPONGE LAP 18X18 X RAY DECT (DISPOSABLE) ×2 IMPLANT
SUT MNCRL AB 3-0 PS2 27 (SUTURE) IMPLANT
SUT MON AB 3-0 SH 27 (SUTURE) ×2
SUT MON AB 3-0 SH27 (SUTURE) ×2 IMPLANT
SUT MON AB 4-0 PS1 27 (SUTURE) IMPLANT
SUT PLAIN 0 NONE (SUTURE) IMPLANT
SUT PLAIN 2 0 (SUTURE) ×1
SUT PLAIN 2 0 XLH (SUTURE) ×2 IMPLANT
SUT PLAIN ABS 2-0 CT1 27XMFL (SUTURE) ×1 IMPLANT
SUT VIC AB 0 CT1 27 (SUTURE) ×2
SUT VIC AB 0 CT1 27XBRD ANBCTR (SUTURE) ×2 IMPLANT
SUT VIC AB 0 CTX 36 (SUTURE) ×2
SUT VIC AB 0 CTX36XBRD ANBCTRL (SUTURE) ×2 IMPLANT
SUT VIC AB 2-0 CT1 27 (SUTURE) ×1
SUT VIC AB 2-0 CT1 TAPERPNT 27 (SUTURE) ×1 IMPLANT
SUT VIC AB 3-0 SH 27 (SUTURE)
SUT VIC AB 3-0 SH 27X BRD (SUTURE) IMPLANT
SUT VIC AB 4-0 PS2 27 (SUTURE) IMPLANT
SYR CONTROL 10ML LL (SYRINGE) ×2 IMPLANT
TOWEL OR 17X24 6PK STRL BLUE (TOWEL DISPOSABLE) ×2 IMPLANT
TRAY FOLEY CATH SILVER 14FR (SET/KITS/TRAYS/PACK) ×2 IMPLANT

## 2016-01-18 NOTE — Transfer of Care (Signed)
Immediate Anesthesia Transfer of Care Note  Patient: Rachel [name]  Procedure(s) Performed: Procedure(s): CESAREAN SECTION (N/A)  Patient Location: PACU  Anesthesia Type:Epidural  [name] of Consciousness: awake  Airway & Oxygen Therapy: Patient Spontanous Breathing  Post-op Assessment: Report given to RN and Post -op Vital signs reviewed and stable  Post vital signs: stable  Last Vitals:  Filed Vitals:   01/18/16 0200 01/18/16 0215  BP: 117/67 120/73  Pulse: 136 92  Temp: 37.9 C   Resp: 22 20    Last Pain:  Filed Vitals:   01/18/16 0257  PainSc: 2          Complications: No apparent anesthesia complications

## 2016-01-18 NOTE — Progress Notes (Signed)
Patient ID: Rachel [name], female   DOB: [birth year], 30 y.o.   MRN: [id number] INTERVAL NOTE: S/P Primary Cesarean Delivery d/t Arrest of descent in second stage / Unsuccessful attempt at Vacuum extraction  S:   Lying in bed resting, min cramping, (+) voids, small bleed, denies HA/NV/dizziness  Unable to get infant aroused enough to breastfeed; breastfed well immediately after delivery  Female / planning circumcision  O:   VSS, AAO x 3, NAD  U-even  Scant lochia  A / P:   PPD #0 / [id number] / PCS   Stable post partum  Routine PP orders  Rachel GowerAWSON, Rachel [name], Rachel [name], Rachel [name], Rachel [name] 01/18/2016, 10:57 AM

## 2016-01-18 NOTE — Lactation Note (Signed)
This note was copied from a baby's chart. Lactation Consultation Note  Patient Name: Boy Carole Binningmanda Zweig ZOXWR'UToday's Date: 01/18/2016 Reason for consult: Initial assessment Mom reports baby has latched well few times since birth, baby now 747 hours old. Baby asleep at this visit. Basic teaching reviewed with Mom, advised to continue to BF with feeding ques. Lactation brochure left for review, advised of OP services and support group. Encouraged Mom to call with next feeding for LC to observe latch, teach hand expression.   Maternal Data Does the patient have breastfeeding experience prior to this delivery?: No  Feeding Feeding Type: Breast Fed Length of feed: 0 min  LATCH Score/Interventions                      Lactation Tools Discussed/Used WIC Program: No   Consult Status Consult Status: Follow-up Date: 01/18/16 Follow-up type: In-patient    Alfred LevinsGranger, Remy Dia Ann 01/18/2016, 10:36 AM

## 2016-01-18 NOTE — Lactation Note (Signed)
This note was copied from a baby's chart. Lactation Consultation Note  Patient Name: Rachel [name]'s Date: 01/18/2016 Reason for consult: Follow-up assessment Assisted Mom with positioning to obtain more depth with latch. Baby demonstrates good suckling bursts, intermittent clicking noted but no discomfort reported by Mom. Encouraged to continue to BF with feeding ques. Cluster feeding discussed. Encouraged to call for assist as needed.   Maternal Data Has patient been taught Hand Expression?: Yes Does the patient have breastfeeding experience prior to this delivery?: No  Feeding Feeding Type: Breast Fed  LATCH Score/Interventions Latch: Grasps breast easily, tongue down, lips flanged, rhythmical sucking. Intervention(s): Adjust position;Assist with latch;Breast massage;Breast compression  Audible Swallowing: A few with stimulation  Type of Nipple: Everted at rest and after stimulation  Comfort (Breast/Nipple): Soft / non-tender     Hold (Positioning): Assistance needed to correctly position infant at breast and maintain latch. Intervention(s): Breastfeeding basics reviewed;Support Pillows;Position options;Skin to skin  LATCH Score: 8  Lactation Tools Discussed/Used     Consult Status Consult Status: Follow-up Date: 01/19/16 Follow-up type: In-patient    Alfred LevinsGranger, Lisandro Meggett Ann 01/18/2016, 2:45 PM

## 2016-01-18 NOTE — Anesthesia Postprocedure Evaluation (Signed)
Anesthesia Post Note  Patient: Carole Binningmanda Everman  Procedure(s) Performed: Procedure(s) (LRB): CESAREAN SECTION (N/A)  Patient location during evaluation: PACU Anesthesia Type: Epidural Level of consciousness: oriented and awake and alert Pain management: pain level controlled Vital Signs Assessment: post-procedure vital signs reviewed and stable Respiratory status: spontaneous breathing, respiratory function stable and patient connected to nasal cannula oxygen Cardiovascular status: blood pressure returned to baseline and stable Postop Assessment: no headache, no backache and epidural receding Anesthetic complications: no     Last Vitals:  Filed Vitals:   01/18/16 0415 01/18/16 0430  BP: 108/77 100/69  Pulse: 111 93  Temp:  36.7 C  Resp: 14 18    Last Pain:  Filed Vitals:   01/18/16 0438  PainSc: 2    Pain Goal:    LLE Motor Response: Non-purposeful movement (01/18/16 0430) LLE Sensation: Numbness (01/18/16 0430) RLE Motor Response: Purposeful movement (01/18/16 0430) RLE Sensation: Numbness (01/18/16 0430)      Shelton SilvasKevin D Hiroyuki Ozanich

## 2016-01-18 NOTE — Addendum Note (Signed)
Addendum  created 01/18/16 0746 by Earmon PhoenixValerie P Macaria Bias, CRNA   Modules edited: Clinical Notes   Clinical Notes:  File: [id number]

## 2016-01-18 NOTE — Op Note (Signed)
Preoperative diagnosis: Intrauterine pregnancy at 41 weeks and 3 days                                             Induction post dates                                            Arrest of descent in second stage                                            Unsuccessful attempt at Vacuum extraction  Post operative diagnosis: Same                                               Occiput posterior   Anesthesia: Epidural  Anesthesiologist: Dr. Cristela Blue  Procedure: Primary Urgent Low Transverse Cesarean Section  Surgeon: Dr. Genia Del  Assistant: None  Estimated blood loss: 800 cc  Procedure:  After being informed of the planned procedure and possible complications including bleeding, infection, injury to other organs, informed consent is obtained. The patient is taken to OR #9 and epidural anesthesia level raised without complication. She is placed in the dorsal decubitus position with the pelvis tilted to the left. She is then prepped and draped in a sterile fashion. A Foley catheter is already inserted in her bladder.  After assessing adequate level of anesthesia, we infiltrate the suprapubic area with 20 cc of Marcaine 0.25 and perform a Pfannenstiel incision which is brought down sharply to the fascia. The fascia is entered in a low transverse fashion. Linea alba is dissected. Peritoneum is entered in a midline fashion. An Alexis retractor is easily positioned. Visceral peritoneum is entered in a low transverse fashion allowing [place] to safely retract bladder by developing a bladder flap.  The myometrium is then entered in a low transverse fashion; first with knife and then extended bluntly. Amniotic fluid is clear. We assist the birth of a female  infant in cephalic Occiput Posterior presentation. Mouth and nose are suctioned. The baby is delivered. The cord is clamped and sectioned. The baby is given to the neonatologist present in the room.  10 cc of blood is drawn from the umbilical  vein. A cord PH is done. The placenta is allowed to deliver spontaneously. It is complete and the cord has 3 vessels. Uterine revision is negative.  We proceed with closure of the myometrium in 2 layers: First with a running locked suture of 0 Vicryl, then with a Lembert suture of 0 Vicryl imbricating the first one. Hemostasis is completed with cauterization on peritoneal edges.  Both paracolic gutters are cleaned. Both tubes and ovaries are assessed and normal.  We confirm a satisfactory hemostasis.  Retractors and sponges are removed. Under fascia hemostasis is completed with cauterization.  The parietal peritoneum is closed with a running suture of Vicryl 2-0.  The fascia is then closed with 2 running sutures of 0 Vicryl meeting midline. The wound is irrigated with warm saline and hemostasis is completed with cauterization.  The adipose tissue is approximated with a Plain 2-0 in a running suture. The skin is closed with a subcuticular suture of 3-0 Monocryl and Dermabond.  Instrument and sponge count is complete x2. Estimated blood loss is 800 cc.  The procedure is well tolerated by the patient who is taken to recovery room in a well and stable condition.  female baby named Lamont SnowballLincoln Mickael was born at 2:42 am and received an Apgar of 8  at 1 minute and 9 at 5 minutes. Weight was pending.    Specimen: Placenta sent to [facility]   Jamesmichael Shadd,MARIE-LYNE MD 7/18/20173:38 AM

## 2016-01-18 NOTE — Anesthesia Postprocedure Evaluation (Signed)
Anesthesia Post Note  Patient: Rachel [name]  Procedure(s) Performed: Procedure(s) (LRB): CESAREAN SECTION (N/A)  Patient location during evaluation: Mother Baby Anesthesia Type: Epidural Level of consciousness: awake and alert Pain management: pain level controlled Vital Signs Assessment: post-procedure vital signs reviewed and stable Respiratory status: spontaneous breathing Cardiovascular status: stable Postop Assessment: no headache, no backache, epidural receding and patient able to bend at knees Anesthetic complications: no     Last Vitals:  Filed Vitals:   01/18/16 0630 01/18/16 0730  BP: 112/72 108/89  Pulse: 76 82  Temp: 37 C   Resp: 17 18    Last Pain:  Filed Vitals:   01/18/16 0745  PainSc: 2    Pain Goal:                 Edison PaceWILKERSON,Heiley Shaikh

## 2016-01-19 LAB — CBC
HEMATOCRIT: 22.5 % — AB (ref 36.0–46.0)
Hemoglobin: 7.9 g/dL — ABNORMAL LOW (ref 12.0–15.0)
MCH: 32 pg (ref 26.0–34.0)
MCHC: 35.1 g/dL (ref 30.0–36.0)
MCV: 91.1 fL (ref 78.0–100.0)
PLATELETS: 116 10*3/uL — AB (ref 150–400)
RBC: 2.47 MIL/uL — AB (ref 3.87–5.11)
RDW: 14.1 % (ref 11.5–15.5)
WBC: 12.6 10*3/uL — AB (ref 4.0–10.5)

## 2016-01-19 LAB — BIRTH TISSUE RECOVERY COLLECTION (PLACENTA DONATION)

## 2016-01-19 NOTE — Progress Notes (Signed)
Subjective: POD# 1 Information for the patient's newborn:  [name], Boy Roselind [[id number]][[id number]]  female  / circ planned  Reports feeling sore but well. Feeding: breast Patient reports tolerating PO.  Breast symptoms:none Pain controlled withibuprofen and Percocet Denies HA/SOB/C/P/N/V/dizziness. Flatus present. She reports vaginal bleeding as normal, without clots.  She is ambulating, urinating without difficult.     Objective:   VS:  Filed Vitals:   01/18/16 1630 01/18/16 2035 01/18/16 2315 01/19/16 0555  BP: 97/52 90/62 102/64 98/57  Pulse: 72 84 83 78  Temp: 98.4 F (36.9 C) 98.3 F (36.8 C) 97.9 F (36.6 C) 98.2 F (36.8 C)  TempSrc: Oral Oral Oral Oral  Resp: 18 18 17 17   Height:      Weight:      SpO2: 96% 98% 98%      Intake/Output Summary (Last 24 hours) at 01/19/16 1115 Last data filed at 01/19/16 0546  Gross per 24 hour  Intake    750 ml  Output   1250 ml  Net   -500 ml        Recent Labs  01/18/16 0430 01/19/16 0623  WBC 17.8* 12.6*  HGB 9.5* 7.9*  HCT 27.7* 22.5*  PLT 126* 116*     Blood type: --/--/B POS (07/17 0055)  Rubella: Immune (11/30 0000)     Physical Exam:  General: alert, cooperative and no distress CV: Regular rate and rhythm Resp: clear Abdomen: soft, nontender, normal bowel sounds Incision: clean, dry, intact and honeycomb dressing Uterine Fundus: firm, below umbilicus, nontender Lochia: minimal Ext: extremities normal, atraumatic, no cyanosis or edema      Assessment/Plan: 30 y.o.   POD# 1. [id number]                  Principal Problem:   Postpartum care following cesarean delivery (7/18) Active Problems:   Pregnancy   Postoperative state ABL anemia, asymptomatic Continuing oral Fe supplementation. Doing well, stable.               Advance diet as tolerated Ambulate Routine post-op care Patient would like DC on POD 2 if able  Neta Mendsaniela C Kayona Foor, CNM 01/19/2016, 11:15 AM

## 2016-01-19 NOTE — Lactation Note (Signed)
This note was copied from a baby's chart. Lactation Consultation Note  Patient Name: Rachel [name] ZOXWR'UToday's Date: 01/19/2016 Reason for consult: Follow-up assessment Baby just came off the breast per Mom. She reports baby is nursing well, denies nipple tenderness. Baby cluster feeding. Encouraged to continue to BF with feeding ques, Basic teaching reviewed. Encouraged to call for questions/concerns or assist as needed.   Maternal Data    Feeding Feeding Type: Breast Fed Length of feed: 60 min  LATCH Score/Interventions                      Lactation Tools Discussed/Used     Consult Status Consult Status: Follow-up Date: 01/20/16 Follow-up type: In-patient    Alfred LevinsGranger, Sherlyn Ebbert Ann 01/19/2016, 2:43 PM

## 2016-01-20 MED ORDER — MAGNESIUM OXIDE 400 (241.3 MG) MG PO TABS: 400.0000 mg | ORAL_TABLET | Freq: Every day | ORAL | Status: DC

## 2016-01-20 MED ORDER — FERROUS SULFATE 325 (65 FE) MG PO TABS: 325.0000 mg | ORAL_TABLET | Freq: Two times a day (BID) | ORAL | Status: DC

## 2016-01-20 MED ORDER — MAGNESIUM OXIDE 400 (241.3 MG) MG PO TABS
400.0000 mg | ORAL_TABLET | Freq: Every day | ORAL | Status: DC
  Administered 2016-01-20: 400 mg via ORAL
  Filled 2016-01-20 (×2): qty 1

## 2016-01-20 MED ORDER — IBUPROFEN 600 MG PO TABS: 600.0000 mg | ORAL_TABLET | Freq: Four times a day (QID) | ORAL | Status: DC

## 2016-01-20 MED ORDER — OXYCODONE HCL 5 MG PO TABS: 5.0000 mg | ORAL_TABLET | ORAL | Status: DC | PRN

## 2016-01-20 NOTE — Progress Notes (Signed)
Patient ID: Rachel [name], female   DOB: 1985/12/23, 30 y.o.   MRN: [id number] POD #2 S/P Primary Cesarean Delivery for Arrest of descent in second stage / Unsuccessful attempt at Vacuum extraction Subjective: POD# 2 Information for the patient's newborn:  Rachel [name], Boy Rachel [name] [[id number]][[id number]]  female / circ in progress  Reports feeling sore but well. Feeding: breast Patient reports tolerating PO.  Breast symptoms: none Pain controlled with ibuprofen and Tylenol. C/O mild dizziness this AM - resolved quickly. Denies HA/SOB/C/P/N/V. Flatus present. No BM. She reports vaginal bleeding as normal, without clots.  She is ambulating, urinating without difficult.     Objective:   VS:  Filed Vitals:   01/18/16 2315 01/19/16 0555 01/19/16 1751 01/20/16 0638  BP: 102/64 98/57 109/70 112/69  Pulse: 83 78 97 86  Temp: 97.9 F (36.6 C) 98.2 F (36.8 C) 98.6 F (37 C) 97.9 F (36.6 C)  TempSrc: Oral Oral Oral   Resp: 17 17 17 18   Height:      Weight:      SpO2: 98%       No intake or output data in the 24 hours ending 01/20/16 1038       Recent Labs  01/18/16 0430 01/19/16 0623  WBC 17.8* 12.6*  HGB 9.5* 7.9*  HCT 27.7* 22.5*  PLT 126* 116*     Blood type: --/--/B POS (07/17 0055)  Rubella: Immune (11/30 0000)     Physical Exam:  General: alert, cooperative and no distress CV: Regular rate and rhythm Resp: clear Abdomen: soft, nontender, normal bowel sounds Incision: clean, dry, intact and honeycomb dressing Uterine Fundus: firm, 2 FB below umbilicus, nontender Lochia: minimal Ext: extremities normal, atraumatic, no cyanosis or edema   Assessment/Plan: 30 y.o. POD# 2. [id number]                  Principal Problem: Postpartum care following cesarean delivery (7/18) Active Problems: ABL anemia  Doing well, stable.               Advance diet as tolerated Ambulate Routine post-op care Continue oral Fe supplementation Start Magnesium Oxide 400 mg daily Early D/C home  today  Rachel [name], Rachel [name], M, MSN, CNM 01/20/2016, 10:38 AM

## 2016-01-20 NOTE — Lactation Note (Signed)
This note was copied from a baby's chart. Lactation Consultation Note  Patient Name: Boy Carole Binningmanda Tufte ZOXWR'UToday's Date: 01/20/2016 Reason for consult: Follow-up assessment Mom reports she started supplementing because baby did not seem satisfied at breast. She plans to continue to supplement till her milk comes in. Offered to assist with latch and teach ways to supplement without using bottles, Mom declined. Advised to use Dr. Manson PasseyBrown bottles #1 which Mom has at home. Encouraged Mom to BF with each feeding before giving bottles. Advised baby should be at breast 8-12 times or more in 24 hours. Plan discussed was for Mom to BF for 15-20 minutes both breasts each feeding, Mom to post pump for 15-20 minutes while FOB gives supplement till milk comes to volume.  Mom has history of PCOS so stressed importance of BF/pumping to encourage milk production and maximize milk production. Encouraged to start supplements either Lactation support by [employer] or More Milk Plus by [employer], lactation cookies. OP f/u scheduled for Friday, 01/28/16 at 2:30. Advised of support group. Engorgement care reviewed if needed, refer to Baby N Me booklet page 24. Encouraged to call for questions/concerns.   Maternal Data    Feeding    LATCH Score/Interventions                      Lactation Tools Discussed/Used Tools: Pump Breast pump type: Double-Electric Breast Pump   Consult Status Consult Status: Complete Date: 01/20/16 Follow-up type: In-patient    Alfred LevinsGranger, Klea Nall Ann 01/20/2016, 12:42 PM

## 2016-01-20 NOTE — Discharge Summary (Signed)
OB Discharge Summary     Patient Name: Rachel [name] DOB: 03/03/1986 MRN: [id number]  Date of admission: 01/17/2016 Delivering MD: Genia Del   Date of discharge: 01/20/2016  Admitting diagnosis: INDUCTION Intrauterine pregnancy: [redacted]w[redacted]d     Secondary diagnosis:  Principal Problem:   Postpartum care following cesarean delivery (7/18) Active Problems:   Pregnancy   Postoperative state  Additional problems: none     Discharge diagnosis: Term Pregnancy Delivered                                                                                                Post partum procedures:none  Augmentation: AROM, Pitocin and Cytotec  Complications: None  Hospital course:  Induction of Labor With Cesarean Section  30 y.o. yo G2P1011 at [redacted]w[redacted]d was admitted to the hospital 01/17/2016 for induction of labor. Patient had a labor course significant for complete dilation achieved, descent in second stage / Unsuccessful attempt at Vacuum extraction. The patient went for cesarean section due to Arrest of Descent and unsuccessful vacuum extraction, and delivered a Viable infant, Membrane Rupture Time/Date: 10:00 AM ,01/17/2016   Details of operation can be found in separate operative Note.  Patient had an uncomplicated postpartum course. She is ambulating, tolerating a regular diet, passing flatus, and urinating well.  Patient is discharged home in stable condition on 01/20/2016.                                  Physical exam  Filed Vitals:   01/18/16 2315 01/19/16 0555 01/19/16 1751 01/20/16 0638  BP: 102/64 98/57 109/70 112/69  Pulse: 83 78 97 86  Temp: 97.9 F (36.6 C) 98.2 F (36.8 C) 98.6 F (37 C) 97.9 F (36.6 C)  TempSrc: Oral Oral Oral   Resp: Height:      Weight:      SpO2: 98%      General: alert, cooperative and no distress Lochia: appropriate Uterine Fundus: firm, midline, U-2 Incision: Healing well with no significant drainage, No significant erythema,  Dressing is clean, dry, and intact, skin well-approximated with sutures DVT Evaluation: No evidence of DVT seen on physical exam. Negative Homan's sign. No cords or calf tenderness. No significant calf/ankle edema. Labs: Lab Results  Component Value Date   WBC 12.6* 01/19/2016   HGB 7.9* 01/19/2016   HCT 22.5* 01/19/2016   MCV 91.1 01/19/2016   PLT 116* 01/19/2016   CMP Latest Ref Rng 12/23/2009  Glucose 70 - 99 mg/dL 92  BUN 6 - 23 mg/dL 6  Creatinine 0.4 - 1.2 mg/dL 8.65  Sodium 784 - 696 mEq/L 136  Potassium 3.5 - 5.1 mEq/L 3.7  Chloride 96 - 112 mEq/L 104  CO2 19 - 32 mEq/L 25  Calcium 8.4 - 10.5 mg/dL 9.0  Total Protein 6.0 - 8.3 g/dL 7.6  Total Bilirubin 0.3 - 1.2 mg/dL 0.3  Alkaline Phos 39 - 117 U/L 62  AST 0 - 37 U/L 18  ALT 0 - 35 U/L 15  Discharge instruction: per After Visit Summary and "Baby and Me Booklet".  After visit meds:    Medication List    TAKE these medications        ferrous sulfate 325 (65 FE) MG tablet  Take 1 tablet (325 mg total) by mouth 2 (two) times daily with a meal.     ibuprofen 600 MG tablet  Commonly known as:  ADVIL,MOTRIN  Take 1 tablet (600 mg total) by mouth every 6 (six) hours.     magnesium oxide 400 (241.3 Mg) MG tablet  Commonly known as:  MAG-OX  Take 1 tablet (400 mg total) by mouth daily.     oxyCODONE 5 MG immediate release tablet  Commonly known as:  Oxy IR/ROXICODONE  Take 1 tablet (5 mg total) by mouth every 4 (four) hours as needed for moderate pain or severe pain.     prenatal multivitamin Tabs tablet  Take 1 tablet by mouth daily at 12 noon.        Diet: routine diet  Activity: Advance as tolerated. Pelvic rest for 6 weeks.   Outpatient follow up:6 weeks Follow up Appt:No future appointments. Follow up Visit:No Follow-up on file.  Postpartum contraception: Undecided  Newborn Data: Live born female on 01/18/2016 Birth Weight: 8 lb 13.1 oz (4000 g) APGAR: 8, 9  Baby Feeding: Bottle and  Breast Disposition:home with mother   01/20/2016 Raelyn MoraAWSON, Lealand Elting, Judie PetitM, CNM

## 2016-01-20 NOTE — Discharge Instructions (Signed)
Breast Pumping Tips °If you are breastfeeding, there may be times when you cannot feed your baby directly. Returning to work or going on a trip are common examples. Pumping allows you to store breast milk and feed it to your baby later.  °You may not get much milk when you first start to pump. Your breasts should start to make more after a few days. If you pump at the times you usually feed your baby, you may be able to keep making enough milk to feed your baby without also using formula. The more often you pump, the more milk you will produce.  °WHEN SHOULD I PUMP?  °· You can begin to pump soon after delivery. However, some experts recommend waiting about 4 weeks before giving your infant a bottle to make sure breastfeeding is going well.  °· If you plan to return to work, begin pumping a few weeks before. This will help you develop techniques that work best for you. It also lets you build up a supply of breast milk.   °· When you are with your infant, feed on demand and pump after each feeding.   °· When you are away from your infant for several hours, pump for about 15 minutes every 2-3 hours. Pump both breasts at the same time if you can.   °· If your infant has a formula feeding, make sure to pump around the same time.     °· If you drink any alcohol, wait 2 hours before pumping.   °HOW DO I PREPARE TO PUMP? °Your let-down reflex is the natural reaction to stimulation that makes your breast milk flow. It is easier to stimulate this reflex when you are relaxed. Find relaxation techniques that work for you. If you have difficulty with your let-down reflex, try these methods:  °· Smell one of your infant's blankets or an item of clothing.   °· Look at a picture or video of your infant.   °· Sit in a quiet, private space.   °· Massage the breast you plan to pump.   °· Place soothing warmth on the breast.   °· Play relaxing music.   °WHAT ARE SOME GENERAL BREAST PUMPING TIPS? °· Wash your hands before you pump. You  do not need to wash your nipples or breasts. °· There are three ways to pump. °· You can use your hand to massage and compress your breast. °· You can use a handheld manual pump. °· You can use an electric pump.   °· Make sure the suction cup (flange) on the breast pump is the right size. Place the flange directly over the nipple. If it is the wrong size or placed the wrong way, it may be painful and cause nipple damage.   °· If pumping is uncomfortable, apply a small amount of purified or modified lanolin to your nipple and areola. °· If you are using an electric pump, adjust the speed and suction power to be more comfortable. °· If pumping is painful or if you are not getting very much milk, you may need a different type of pump. A lactation consultant can help you determine what type of pump to use.   °· Keep a full water bottle near you at all times. Drinking lots of fluid helps you make more milk.  °· You can store your milk to use later. Pumped breast milk can be stored in a sealable, sterile container or plastic bag. Label all stored breast milk with the date you pumped it. °· Milk can stay out at room temperature for up to 8 hours. °·   You can store your milk in the refrigerator for up to 8 days. °· You can store your milk in the freezer for 3 months. Thaw frozen milk using warm water. Do not put it in the microwave. °· Do not smoke. Smoking can lower your milk supply and harm your infant. If you need help quitting, ask your health care provider to recommend a program.   °WHEN SHOULD I CALL MY HEALTH CARE PROVIDER OR A LACTATION CONSULTANT? °· You are having trouble pumping. °· You are concerned that you are not making enough milk. °· You have nipple pain, soreness, or redness. °· You want to use birth control. Birth control pills may lower your milk supply. Talk to your health care provider about your options. °  °This information is not intended to replace advice given to you by your health care provider.  Make sure you discuss any questions you have with your health care provider. °  °Document Released: 12/07/2009 Document Revised: 06/24/2013 Document Reviewed: 04/11/2013 °[employer]vier Interactive Patient Education ©2016 [employer] °Postpartum Depression and Baby Blues °The postpartum period begins right after the birth of a baby. During this time, there is often a great amount of joy and excitement. It is also a time of many changes in the life of the parents. Regardless of how many times a mother gives birth, each child brings new challenges and dynamics to the family. It is not unusual to have feelings of excitement along with confusing shifts in moods, emotions, and thoughts. All mothers are at risk of developing postpartum depression or the "baby blues." These mood changes can occur right after giving birth, or they may occur many months after giving birth. The baby blues or postpartum depression can be mild or severe. Additionally, postpartum depression can go away rather quickly, or it can be a long-term condition.  °CAUSES °Raised hormone levels and the rapid drop in those levels are thought to be a main cause of postpartum depression and the baby blues. A number of hormones change during and after pregnancy. Estrogen and progesterone usually decrease right after the delivery of your baby. The levels of thyroid hormone and various cortisol steroids also rapidly drop. Other factors that play a role in these mood changes include major life events and genetics.  °RISK FACTORS °If you have any of the following risks for the baby blues or postpartum depression, know what symptoms to watch out for during the postpartum period. Risk factors that may increase the likelihood of getting the baby blues or postpartum depression include: °· Having a personal or family history of depression.   °· Having depression while being pregnant.   °· Having premenstrual mood issues or mood issues related to oral  contraceptives. °· Having a lot of life stress.   °· Having marital conflict.   °· Lacking a social support network.   °· Having a baby with special needs.   °· Having health problems, such as diabetes.   °SIGNS AND SYMPTOMS °Symptoms of baby blues include: °· Brief changes in mood, such as going from extreme happiness to sadness. °· Decreased concentration.   °· Difficulty sleeping.   °· Crying spells, tearfulness.   °· Irritability.   °· Anxiety.   °Symptoms of postpartum depression typically begin within the first month after giving birth. These symptoms include: °· Difficulty sleeping or excessive sleepiness.   °· Marked weight loss.   °· Agitation.   °· Feelings of worthlessness.   °· Lack of interest in activity or food.   °Postpartum psychosis is a very serious condition and can be dangerous. Fortunately, it is   rare. Displaying any of the following symptoms is cause for immediate medical attention. Symptoms of postpartum psychosis include:  °· Hallucinations and delusions.   °· Bizarre or disorganized behavior.   °· Confusion or disorientation.   °DIAGNOSIS  °A diagnosis is made by an evaluation of your symptoms. There are no medical or lab tests that lead to a diagnosis, but there are various questionnaires that a health care provider may use to identify those with the baby blues, postpartum depression, or psychosis. Often, a screening tool called the Edinburgh Postnatal Depression Scale is used to diagnose depression in the postpartum period.  °TREATMENT °The baby blues usually goes away on its own in 1-2 weeks. Social support is often all that is needed. You will be encouraged to get adequate sleep and rest. Occasionally, you may be given medicines to help you sleep.  °Postpartum depression requires treatment because it can last several months or longer if it is not treated. Treatment may include individual or group therapy, medicine, or both to address any social, physiological, and psychological factors  that may play a role in the depression. Regular exercise, a healthy diet, rest, and social support may also be strongly recommended.  °Postpartum psychosis is more serious and needs treatment right away. Hospitalization is often needed. °HOME CARE INSTRUCTIONS °· Get as much rest as you can. Nap when the baby sleeps.   °· Exercise regularly. Some women find yoga and walking to be beneficial.   °· Eat a balanced and nourishing diet.   °· Do little things that you enjoy. Have a cup of tea, take a bubble bath, read your favorite magazine, or listen to your favorite music. °· Avoid alcohol.   °· Ask for help with household chores, cooking, grocery shopping, or running errands as needed. Do not try to do everything.   °· Talk to people close to you about how you are feeling. Get support from your partner, family members, friends, or other new moms. °· Try to stay positive in how you think. Think about the things you are grateful for.   °· Do not spend a lot of time alone.   °· Only take over-the-counter or prescription medicine as directed by your health care provider. °· Keep all your postpartum appointments.   °· Let your health care provider know if you have any concerns.   °SEEK MEDICAL CARE IF: °You are having a reaction to or problems with your medicine. °SEEK IMMEDIATE MEDICAL CARE IF: °· You have suicidal feelings.   °· You think you may harm the baby or someone else. °MAKE SURE YOU: °· Understand these instructions. °· Will watch your condition. °· Will get help right away if you are not doing well or get worse. °  °This information is not intended to replace advice given to you by your health care provider. Make sure you discuss any questions you have with your health care provider. °  °Document Released: 03/23/2004 Document Revised: 06/24/2013 Document Reviewed: 03/31/2013 °Elsevier Interactive Patient Education ©2016 [employer] °Postpartum Care After Cesarean Delivery °After you deliver your newborn  (postpartum period), the usual stay in the hospital is 24-72 hours. If there were problems with your labor or delivery, or if you have other medical problems, you might be in the hospital longer.  °While you are in the hospital, you will receive help and instructions on how to care for yourself and your newborn during the postpartum period.  °While you are in the hospital: °· It is normal for you to have pain or discomfort from the incision in your   abdomen. Be sure to tell your nurses when you are having pain, where the pain is located, and what makes the pain worse. °· If you are breastfeeding, you may feel uncomfortable contractions of your uterus for a couple of weeks. This is normal. The contractions help your uterus get back to normal size. °· It is normal to have some bleeding after delivery. °· For the first 1-3 days after delivery, the flow is red and the amount may be similar to a period. °· It is common for the flow to start and stop. °· In the first few days, you may pass some small clots. Let your nurses know if you begin to pass large clots or your flow increases. °· Do not  flush blood clots down the toilet before having the nurse look at them. °· During the next 3-10 days after delivery, your flow should become more watery and pink or brown-tinged in color. °· Ten to fourteen days after delivery, your flow should be a small amount of yellowish-white discharge. °· The amount of your flow will decrease over the first few weeks after delivery. Your flow may stop in 6-8 weeks. Most women have had their flow stop by 12 weeks after delivery. °· You should change your sanitary pads frequently. °· Wash your hands thoroughly with soap and water for at least 20 seconds after changing pads, using the toilet, or before holding or feeding your newborn. °· Your intravenous (IV) tubing will be removed when you are drinking enough fluids. °· The urine drainage tube (urinary catheter) that was inserted before delivery  may be removed within 6-8 hours after delivery or when feeling returns to your legs. You should feel like you need to empty your bladder within the first 6-8 hours after the catheter has been removed. °· In case you become weak, lightheaded, or faint, call your nurse before you get out of bed for the first time and before you take a shower for the first time. °· Within the first few days after delivery, your breasts may begin to feel tender and full. This is called engorgement. Breast tenderness usually goes away within 48-72 hours after engorgement occurs. You may also notice milk leaking from your breasts. If you are not breastfeeding, do not stimulate your breasts. Breast stimulation can make your breasts produce more milk. °· Spending as much time as possible with your newborn is very important. During this time, you and your newborn can feel close and get to know each other. Having your newborn stay in your room (rooming in) will help to strengthen the bond with your newborn. It will give you time to get to know your newborn and become comfortable caring for your newborn. °· Your hormones change after delivery. Sometimes the hormone changes can temporarily cause you to feel sad or tearful. These feelings should not last more than a few days. If these feelings last longer than that, you should talk to your caregiver. °· If desired, talk to your caregiver about methods of family planning or contraception. °· Talk to your caregiver about immunizations. Your caregiver may want you to have the following immunizations before leaving the hospital: °· Tetanus, diphtheria, and pertussis (Tdap) or tetanus and diphtheria (Td) immunization. It is very important that you and your family (including grandparents) or others caring for your newborn are up-to-date with the Tdap or Td immunizations. The Tdap or Td immunization can help protect your newborn from getting ill. °· Rubella immunization. °· Varicella (chickenpox)    immunization. °· Influenza immunization. You should receive this annual immunization if you did not receive the immunization during your pregnancy. °  °This information is not intended to replace advice given to you by your health care provider. Make sure you discuss any questions you have with your health care provider. °  °Document Released: 03/13/2012 Document Reviewed: 03/13/2012 °[employer]vier Interactive Patient Education ©2016 [employer] °Breastfeeding and Mastitis °Mastitis is inflammation of the breast tissue. It can occur in women who are breastfeeding. This can make breastfeeding painful. Mastitis will sometimes go away on its own. Your health care provider will help determine if treatment is needed. °CAUSES °Mastitis is often associated with a blocked milk (lactiferous) duct. This can happen when too much milk builds up in the breast. Causes of excess milk in the breast can include: °· Poor latch-on. If your baby is not latched onto the breast properly, she or he may not empty your breast completely while breastfeeding. °· Allowing too much time to pass between feedings. °· Wearing a bra or other clothing that is too tight. This puts extra pressure on the lactiferous ducts so milk does not flow through them as it should. °Mastitis can also be caused by a bacterial infection. Bacteria may enter the breast tissue through cuts or openings in the skin. In women who are breastfeeding, this may occur because of cracked or irritated skin. Cracks in the skin are often caused when your baby does not latch on properly to the breast. °SIGNS AND SYMPTOMS °· Swelling, redness, tenderness, and pain in an area of the breast. °· Swelling of the glands under the arm on the same side. °· Fever may or may not accompany mastitis. °If an infection is allowed to progress, a collection of pus (abscess) may develop. °DIAGNOSIS  °Your health care provider can usually diagnose mastitis based on your symptoms and a physical exam.  Tests may be done to help confirm the diagnosis. These may include: °· Removal of pus from the breast by applying pressure to the area. This pus can be examined in the lab to determine which bacteria are present. If an abscess has developed, the fluid in the abscess can be removed with a needle. This can also be used to confirm the diagnosis and determine the bacteria present. In most cases, pus will not be present. °· Blood tests to determine if your body is fighting a bacterial infection. °· Mammogram or ultrasound tests to rule out other problems or diseases. °TREATMENT  °Mastitis that occurs with breastfeeding will sometimes go away on its own. Your health care provider may choose to wait 24 hours after first seeing you to decide whether a prescription medicine is needed. If your symptoms are worse after 24 hours, your health care provider will likely prescribe an antibiotic medicine to treat the mastitis. He or she will determine which bacteria are most likely causing the infection and will then select an appropriate antibiotic medicine. This is sometimes changed based on the results of tests performed to identify the bacteria, or if there is no response to the antibiotic medicine selected. Antibiotic medicines are usually given by mouth. You may also be given medicine for pain. °HOME CARE INSTRUCTIONS °· Only take over-the-counter or prescription medicines for pain, fever, or discomfort as directed by your health care provider. °· If your health care provider prescribed an antibiotic medicine, take the medicine as directed. Make sure you finish it even if you start to feel better. °· Do not wear a   tight or underwire bra. Wear a soft, supportive bra. °· Increase your fluid intake, especially if you have a fever. °· Continue to empty the breast. Your health care provider can tell you whether this milk is safe for your infant or needs to be thrown out. You may be told to stop nursing until your health care  provider thinks it is safe for your baby. Use a breast pump if you are advised to stop nursing. °· Keep your nipples clean and dry. °· Empty the first breast completely before going to the other breast. If your baby is not emptying your breasts completely for some reason, use a breast pump to empty your breasts. °· If you go back to work, pump your breasts while at work to stay in time with your nursing schedule. °· Avoid allowing your breasts to become overly filled with milk (engorged). °SEEK MEDICAL CARE IF: °· You have pus-like discharge from the breast. °· Your symptoms do not improve with the treatment prescribed by your health care provider within 2 days. °SEEK IMMEDIATE MEDICAL CARE IF: °· Your pain and swelling are getting worse. °· You have pain that is not controlled with medicine. °· You have a red line extending from the breast toward your armpit. °· You have a fever or persistent symptoms for more than 2-3 days. °· You have a fever and your symptoms suddenly get worse. °MAKE SURE YOU:  °· Understand these instructions. °· Will watch your condition. °· Will get help right away if you are not doing well or get worse. °  °This information is not intended to replace advice given to you by your health care provider. Make sure you discuss any questions you have with your health care provider. °  °Document Released: 10/14/2004 Document Revised: 06/24/2013 Document Reviewed: 01/23/2013 °[employer]vier Interactive Patient Education ©2016 [employer] °Breastfeeding °Deciding to breastfeed is one of the best choices you can make for you and your baby. A change in hormones during pregnancy causes your breast tissue to grow and increases the number and size of your milk ducts. These hormones also allow proteins, sugars, and fats from your blood supply to make breast milk in your milk-producing glands. Hormones prevent breast milk from being released before your baby is born as well as prompt milk flow after birth. Once  breastfeeding has begun, thoughts of your baby, as well as his or her sucking or crying, can stimulate the release of milk from your milk-producing glands.  °BENEFITS OF BREASTFEEDING °For Your Baby °· Your first milk (colostrum) helps your baby's digestive system function better. °· There are antibodies in your milk that help your baby fight off infections. °· Your baby has a lower incidence of asthma, allergies, and sudden infant death syndrome. °· The nutrients in breast milk are better for your baby than infant formulas and are designed uniquely for your baby's needs. °· Breast milk improves your baby's brain development. °· Your baby is less likely to develop other conditions, such as childhood obesity, asthma, or type 2 diabetes mellitus. °For You °· Breastfeeding helps to create a very special bond between you and your baby. °· Breastfeeding is convenient. Breast milk is always available at the correct temperature and costs nothing. °· Breastfeeding helps to burn calories and helps you lose the weight gained during pregnancy. °· Breastfeeding makes your uterus contract to its prepregnancy size faster and slows bleeding (lochia) after you give birth.   °· Breastfeeding helps to lower your risk of developing type   2 diabetes mellitus, osteoporosis, and breast or ovarian cancer later in life. °SIGNS THAT YOUR BABY IS HUNGRY °Early Signs of Hunger °· Increased alertness or activity. °· Stretching. °· Movement of the head from side to side. °· Movement of the head and opening of the mouth when the corner of the mouth or cheek is stroked (rooting). °· Increased sucking sounds, smacking lips, cooing, sighing, or squeaking. °· Hand-to-mouth movements. °· Increased sucking of fingers or hands. °Late Signs of Hunger °· Fussing. °· Intermittent crying. °Extreme Signs of Hunger °Signs of extreme hunger will require calming and consoling before your baby will be able to breastfeed successfully. Do not wait for the  following signs of extreme hunger to occur before you initiate breastfeeding: °· Restlessness. °· A loud, strong cry. °· Screaming. °BREASTFEEDING BASICS °Breastfeeding Initiation °· Find a comfortable place to sit or lie down, with your neck and back well supported. °· Place a pillow or rolled up blanket under your baby to bring him or her to the level of your breast (if you are seated). Nursing pillows are specially designed to help support your arms and your baby while you breastfeed. °· Make sure that your baby's abdomen is facing your abdomen. °· Gently massage your breast. With your fingertips, massage from your chest wall toward your nipple in a circular motion. This encourages milk flow. You may need to continue this action during the feeding if your milk flows slowly. °· Support your breast with 4 fingers underneath and your thumb above your nipple. Make sure your fingers are well away from your nipple and your baby's mouth. °· Stroke your baby's lips gently with your finger or nipple. °· When your baby's mouth is open wide enough, quickly bring your baby to your breast, placing your entire nipple and as much of the colored area around your nipple (areola) as possible into your baby's mouth. °· More areola should be visible above your baby's upper lip than below the lower lip. °· Your baby's tongue should be between his or her lower gum and your breast. °· Ensure that your baby's mouth is correctly positioned around your nipple (latched). Your baby's lips should create a seal on your breast and be turned out (everted). °· It is common for your baby to suck about 2-3 minutes in order to start the flow of breast milk. °Latching °Teaching your baby how to latch on to your breast properly is very important. An improper latch can cause nipple pain and decreased milk supply for you and poor weight gain in your baby. Also, if your baby is not latched onto your nipple properly, he or she may swallow some air during  feeding. This can make your baby fussy. Burping your baby when you switch breasts during the feeding can help to get rid of the air. However, teaching your baby to latch on properly is still the best way to prevent fussiness from swallowing air while breastfeeding. °Signs that your baby has successfully latched on to your nipple: °· Silent tugging or silent sucking, without causing you pain. °· Swallowing heard between every 3-4 sucks. °· Muscle movement above and in front of his or her ears while sucking. °Signs that your baby has not successfully latched on to nipple: °· Sucking sounds or smacking sounds from your baby while breastfeeding. °· Nipple pain. °If you think your baby has not latched on correctly, slip your finger into the corner of your baby's mouth to break the suction and place it   between your baby's gums. Attempt breastfeeding initiation again. °Signs of Successful Breastfeeding °Signs from your baby: °· A gradual decrease in the number of sucks or complete cessation of sucking. °· Falling asleep. °· Relaxation of his or her body. °· Retention of a small amount of milk in his or her mouth. °· Letting go of your breast by himself or herself. °Signs from you: °· Breasts that have increased in firmness, weight, and size 1-3 hours after feeding. °· Breasts that are softer immediately after breastfeeding. °· Increased milk volume, as well as a change in milk consistency and color by the fifth day of breastfeeding. °· Nipples that are not sore, cracked, or bleeding. °Signs That Your Baby is Getting Enough Milk °· Wetting at least 3 diapers in a 24-hour period. The urine should be clear and pale yellow by age 5 days. °· At least 3 stools in a 24-hour period by age 5 days. The stool should be soft and yellow. °· At least 3 stools in a 24-hour period by age 7 days. The stool should be seedy and yellow. °· No loss of weight greater than 10% of birth weight during the first 3 days of age. °· Average weight  gain of 4-7 ounces (113-198 g) per week after age 4 days. °· Consistent daily weight gain by age 5 days, without weight loss after the age of 2 weeks. °After a feeding, your baby may spit up a small amount. This is common. °BREASTFEEDING FREQUENCY AND DURATION °Frequent feeding will help you make more milk and can prevent sore nipples and breast engorgement. Breastfeed when you feel the need to reduce the fullness of your breasts or when your baby shows signs of hunger. This is called "breastfeeding on demand." Avoid introducing a pacifier to your baby while you are working to establish breastfeeding (the first 4-6 weeks after your baby is born). After this time you may choose to use a pacifier. Research has shown that pacifier use during the first year of a baby's life decreases the risk of sudden infant death syndrome (SIDS). °Allow your baby to feed on each breast as long as he or she wants. Breastfeed until your baby is finished feeding. When your baby unlatches or falls asleep while feeding from the first breast, offer the second breast. Because newborns are often sleepy in the first few weeks of life, you may need to awaken your baby to get him or her to feed. °Breastfeeding times will vary from baby to baby. However, the following rules can serve as a guide to help you ensure that your baby is properly fed: °· Newborns (babies 4 weeks of age or younger) may breastfeed every 1-3 hours. °· Newborns should not go longer than 3 hours during the day or 5 hours during the night without breastfeeding. °· You should breastfeed your baby a minimum of 8 times in a 24-hour period until you begin to introduce solid foods to your baby at around 6 months of age. °BREAST MILK PUMPING °Pumping and storing breast milk allows you to ensure that your baby is exclusively fed your breast milk, even at times when you are unable to breastfeed. This is especially important if you are going back to work while you are still  breastfeeding or when you are not able to be present during feedings. Your lactation consultant can give you guidelines on how long it is safe to store breast milk. °A breast pump is a machine that allows you to pump milk   from your breast into a sterile bottle. The pumped breast milk can then be stored in a refrigerator or freezer. Some breast pumps are operated by hand, while others use electricity. Ask your lactation consultant which type will work best for you. Breast pumps can be purchased, but some hospitals and breastfeeding support groups lease breast pumps on a monthly basis. A lactation consultant can teach you how to hand express breast milk, if you prefer not to use a pump. °CARING FOR YOUR BREASTS WHILE YOU BREASTFEED °Nipples can become dry, cracked, and sore while breastfeeding. The following recommendations can help keep your breasts moisturized and healthy: °· Avoid using soap on your nipples. °· Wear a supportive bra. Although not required, special nursing bras and tank tops are designed to allow access to your breasts for breastfeeding without taking off your entire bra or top. Avoid wearing underwire-style bras or extremely tight bras. °· Air dry your nipples for 3-4 minutes after each feeding. °· Use only cotton bra pads to absorb leaked breast milk. Leaking of breast milk between feedings is normal. °· Use lanolin on your nipples after breastfeeding. Lanolin helps to maintain your skin's normal moisture barrier. If you use pure lanolin, you do not need to wash it off before feeding your baby again. Pure lanolin is not toxic to your baby. You may also hand express a few drops of breast milk and gently massage that milk into your nipples and allow the milk to air dry. °In the first few weeks after giving birth, some women experience extremely full breasts (engorgement). Engorgement can make your breasts feel heavy, warm, and tender to the touch. Engorgement peaks within 3-5 days after you give  birth. The following recommendations can help ease engorgement: °· Completely empty your breasts while breastfeeding or pumping. You may want to start by applying warm, moist heat (in the shower or with warm water-soaked hand towels) just before feeding or pumping. This increases circulation and helps the milk flow. If your baby does not completely empty your breasts while breastfeeding, pump any extra milk after he or she is finished. °· Wear a snug bra (nursing or regular) or tank top for 1-2 days to signal your body to slightly decrease milk production. °· Apply ice packs to your breasts, unless this is too uncomfortable for you. °· Make sure that your baby is latched on and positioned properly while breastfeeding. °If engorgement persists after 48 hours of following these recommendations, contact your health care provider or a lactation consultant. °OVERALL HEALTH CARE RECOMMENDATIONS WHILE BREASTFEEDING °· Eat healthy foods. Alternate between meals and snacks, eating 3 of each per day. Because what you eat affects your breast milk, some of the foods may make your baby more irritable than usual. Avoid eating these foods if you are sure that they are negatively affecting your baby. °· Drink milk, fruit juice, and water to satisfy your thirst (about 10 glasses a day). °· Rest often, relax, and continue to take your prenatal vitamins to prevent fatigue, stress, and anemia. °· Continue breast self-awareness checks. °· Avoid chewing and smoking tobacco. Chemicals from cigarettes that pass into breast milk and exposure to secondhand smoke may harm your baby. °· Avoid alcohol and drug use, including marijuana. °Some medicines that may be harmful to your baby can pass through breast milk. It is important to ask your health care provider before taking any medicine, including all over-the-counter and prescription medicine as well as vitamin and herbal supplements. °It is possible to become   pregnant while breastfeeding. If  birth control is desired, ask your health care provider about options that will be safe for your baby. °SEEK MEDICAL CARE IF: °· You feel like you want to stop breastfeeding or have become frustrated with breastfeeding. °· You have painful breasts or nipples. °· Your nipples are cracked or bleeding. °· Your breasts are red, tender, or warm. °· You have a swollen area on either breast. °· You have a fever or chills. °· You have nausea or vomiting. °· You have drainage other than breast milk from your nipples. °· Your breasts do not become full before feedings by the fifth day after you give birth. °· You feel sad and depressed. °· Your baby is too sleepy to eat well. °· Your baby is having trouble sleeping.   °· Your baby is wetting less than 3 diapers in a 24-hour period. °· Your baby has less than 3 stools in a 24-hour period. °· Your baby's skin or the white part of his or her eyes becomes yellow.   °· Your baby is not gaining weight by 5 days of age. °SEEK IMMEDIATE MEDICAL CARE IF: °· Your baby is overly tired (lethargic) and does not want to wake up and feed. °· Your baby develops an unexplained fever. °  °This information is not intended to replace advice given to you by your health care provider. Make sure you discuss any questions you have with your health care provider. °  °Document Released: 06/19/2005 Document Revised: 03/10/2015 Document Reviewed: 12/11/2012 °Elsevier Interactive Patient Education ©2016 [employer] ° °

## 2016-01-28 ENCOUNTER — Ambulatory Visit (HOSPITAL_COMMUNITY): Payer: PRIVATE HEALTH INSURANCE

## 2016-06-21 DIAGNOSIS — J029 Acute pharyngitis, unspecified: Secondary | ICD-10-CM | POA: Diagnosis not present

## 2017-09-22 DIAGNOSIS — J1089 Influenza due to other identified influenza virus with other manifestations: Secondary | ICD-10-CM | POA: Diagnosis not present

## 2017-11-06 DIAGNOSIS — Z3201 Encounter for pregnancy test, result positive: Secondary | ICD-10-CM | POA: Diagnosis not present

## 2017-11-27 DIAGNOSIS — Z3201 Encounter for pregnancy test, result positive: Secondary | ICD-10-CM | POA: Diagnosis not present

## 2017-12-03 DIAGNOSIS — Z3481 Encounter for supervision of other normal pregnancy, first trimester: Secondary | ICD-10-CM | POA: Diagnosis not present

## 2017-12-03 DIAGNOSIS — Z3689 Encounter for other specified antenatal screening: Secondary | ICD-10-CM | POA: Diagnosis not present

## 2017-12-05 DIAGNOSIS — R070 Pain in throat: Secondary | ICD-10-CM | POA: Diagnosis not present

## 2017-12-05 DIAGNOSIS — J069 Acute upper respiratory infection, unspecified: Secondary | ICD-10-CM | POA: Diagnosis not present

## 2017-12-14 DIAGNOSIS — Z01419 Encounter for gynecological examination (general) (routine) without abnormal findings: Secondary | ICD-10-CM | POA: Diagnosis not present

## 2017-12-14 DIAGNOSIS — Z118 Encounter for screening for other infectious and parasitic diseases: Secondary | ICD-10-CM | POA: Diagnosis not present

## 2017-12-14 DIAGNOSIS — Z3481 Encounter for supervision of other normal pregnancy, first trimester: Secondary | ICD-10-CM | POA: Diagnosis not present

## 2017-12-14 DIAGNOSIS — Z3689 Encounter for other specified antenatal screening: Secondary | ICD-10-CM | POA: Diagnosis not present

## 2017-12-14 DIAGNOSIS — Z113 Encounter for screening for infections with a predominantly sexual mode of transmission: Secondary | ICD-10-CM | POA: Diagnosis not present

## 2017-12-19 DIAGNOSIS — J209 Acute bronchitis, unspecified: Secondary | ICD-10-CM | POA: Diagnosis not present

## 2018-01-04 DIAGNOSIS — O021 Missed abortion: Secondary | ICD-10-CM | POA: Diagnosis not present

## 2018-01-09 DIAGNOSIS — O021 Missed abortion: Secondary | ICD-10-CM | POA: Diagnosis not present

## 2018-01-28 DIAGNOSIS — O021 Missed abortion: Secondary | ICD-10-CM | POA: Diagnosis not present

## 2018-01-28 DIAGNOSIS — O09299 Supervision of pregnancy with other poor reproductive or obstetric history, unspecified trimester: Secondary | ICD-10-CM | POA: Diagnosis not present

## 2018-07-03 DIAGNOSIS — J020 Streptococcal pharyngitis: Secondary | ICD-10-CM | POA: Diagnosis not present

## 2018-09-03 DIAGNOSIS — J209 Acute bronchitis, unspecified: Secondary | ICD-10-CM | POA: Diagnosis not present

## 2018-12-24 DIAGNOSIS — N912 Amenorrhea, unspecified: Secondary | ICD-10-CM | POA: Diagnosis not present

## 2018-12-24 DIAGNOSIS — K429 Umbilical hernia without obstruction or gangrene: Secondary | ICD-10-CM | POA: Diagnosis not present

## 2018-12-24 DIAGNOSIS — Z3202 Encounter for pregnancy test, result negative: Secondary | ICD-10-CM | POA: Diagnosis not present

## 2019-02-03 DIAGNOSIS — E282 Polycystic ovarian syndrome: Secondary | ICD-10-CM | POA: Diagnosis not present

## 2019-02-03 DIAGNOSIS — Z6835 Body mass index (BMI) 35.0-35.9, adult: Secondary | ICD-10-CM | POA: Diagnosis not present

## 2019-02-03 DIAGNOSIS — Z124 Encounter for screening for malignant neoplasm of cervix: Secondary | ICD-10-CM | POA: Diagnosis not present

## 2019-02-03 DIAGNOSIS — Z1151 Encounter for screening for human papillomavirus (HPV): Secondary | ICD-10-CM | POA: Diagnosis not present

## 2019-02-03 DIAGNOSIS — N911 Secondary amenorrhea: Secondary | ICD-10-CM | POA: Diagnosis not present

## 2019-02-03 DIAGNOSIS — N96 Recurrent pregnancy loss: Secondary | ICD-10-CM | POA: Diagnosis not present

## 2019-02-03 DIAGNOSIS — Z01419 Encounter for gynecological examination (general) (routine) without abnormal findings: Secondary | ICD-10-CM | POA: Diagnosis not present

## 2019-02-06 ENCOUNTER — Other Ambulatory Visit: Payer: Self-pay

## 2019-02-06 DIAGNOSIS — Z20822 Contact with and (suspected) exposure to covid-19: Secondary | ICD-10-CM

## 2019-02-07 LAB — SPECIMEN STATUS REPORT

## 2019-02-07 LAB — NOVEL CORONAVIRUS, NAA: SARS-CoV-2, NAA: NOT DETECTED

## 2019-02-12 DIAGNOSIS — N96 Recurrent pregnancy loss: Secondary | ICD-10-CM | POA: Diagnosis not present

## 2019-03-21 ENCOUNTER — Other Ambulatory Visit: Payer: Self-pay | Admitting: Surgery

## 2019-03-21 DIAGNOSIS — R140 Abdominal distension (gaseous): Secondary | ICD-10-CM

## 2019-03-21 DIAGNOSIS — K429 Umbilical hernia without obstruction or gangrene: Secondary | ICD-10-CM | POA: Diagnosis not present

## 2019-04-01 ENCOUNTER — Ambulatory Visit
Admission: RE | Admit: 2019-04-01 | Discharge: 2019-04-01 | Disposition: A | Discharge status: Home / Self Care | Payer: PRIVATE HEALTH INSURANCE | Source: Ambulatory Visit | Attending: Surgery | Admitting: Surgery

## 2019-04-01 DIAGNOSIS — K7689 Other specified diseases of liver: Secondary | ICD-10-CM | POA: Diagnosis not present

## 2019-04-01 DIAGNOSIS — R140 Abdominal distension (gaseous): Secondary | ICD-10-CM

## 2019-04-02 ENCOUNTER — Ambulatory Visit: Payer: Self-pay | Admitting: Surgery

## 2019-04-02 NOTE — Progress Notes (Signed)
Please call the patient and let them know that their ultrasound was normal with no sign of gallstones.  We will proceed with posting her for an umbilical hernia repair.

## 2019-04-02 NOTE — H&P (Signed)
History of Present Illness Rachel [name]. Rachel Vilardi MD; 03/21/2019 11:10 AM) The patient is a 33 year old female who presents with an umbilical hernia. Referred by Dr. Dereck Ligas for umbilical hernia.  This is a 33 year old female good health who presents with a one-year history of a palpable hernia at her umbilicus. The patient was pregnant last year and also had a lot of coughing during her pregnancy. She developed small bulge in this area. Unfortunately she suffered a miscarriage. The umbilical discomfort has increased over the last several months. 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 She denies any obstructive symptoms. The patient does report some postprandial abdominal symptoms. Often when she eats certain types of food, she becomes quite bloated and occasionally nauseated. Most of her discomfort is associated with the area just above the umbilicus. This wraps around both sides.   Problem List/Past Medical Rachel Hazard K. Hong Moring, MD; 03/21/2019 11:10 AM) UMBILICAL HERNIA WITHOUT OBSTRUCTION OR GANGRENE (K42.9)  POSTPRANDIAL ABDOMINAL BLOATING (R14.0)   Past Surgical History Rachel Bickers, LPN; 6/38/4536 46:80 AM) Cesarean Section - 1  Oral Surgery   Diagnostic Studies History Rachel Bickers, LPN; 09/21/2246 25:00 AM) Colonoscopy  5-10 years ago Pap Smear  1-5 years ago  Allergies Rachel Bickers, LPN; 3/70/4888 91:69 AM) No Known Allergies  [03/21/2019]:  Medication History Rachel Bickers, LPN; 4/50/3888 28:00 AM) Folic Acid (1MG  Tablet, Oral) Active.  Social History , LPN; Rachel [name] 3/49/1791 AM) Alcohol use  Occasional alcohol use. Caffeine use  Coffee. No drug use  Tobacco use  Never smoker.  Family History 50:56, LPN; Rachel [name] 9/79/4801 AM) Arthritis  Father. Depression  Sister. Hypertension  Father, Mother. Thyroid problems  Mother, Sister.  Pregnancy / Birth History 65:53, LPN;  Rachel [name] 10:52 AM) Age at menarche  11 years. Gravida  3 Irregular periods  Maternal age  59-30 Para  1  Other Problems 38-31. Rachel Stennis, MD; 03/21/2019 11:10 AM) Hemorrhoids  Umbilical Hernia Repair     Review of Systems 03/23/2019 [facility] LPN; [facility] 8/67/5449 AM) General Not Present- Appetite Loss, Chills, Fatigue, Fever, Night Sweats, Weight Gain and Weight Loss. Skin Not Present- Change in Wart/Mole, Dryness, Hives, Jaundice, New Lesions, Non-Healing Wounds, Rash and Ulcer. HEENT Not Present- Earache, Hearing Loss, Hoarseness, Nose Bleed, Oral Ulcers, Ringing in the Ears, Seasonal Allergies, Sinus Pain, Sore Throat, Visual Disturbances, Wears glasses/contact lenses and Yellow Eyes. Respiratory Not Present- Bloody sputum, Chronic Cough, Difficulty Breathing, Snoring and Wheezing. Breast Not Present- Breast Mass, Breast Pain, Nipple Discharge and Skin Changes. Cardiovascular Not Present- Chest Pain, Difficulty Breathing Lying Down, Leg Cramps, Palpitations, Rapid Heart Rate, Shortness of Breath and Swelling of Extremities. Gastrointestinal Present- Bloating, Bloody Stool and Excessive gas. Not Present- Abdominal Pain, Change in Bowel Habits, Chronic diarrhea, Constipation, Difficulty Swallowing, Gets full quickly at meals, Hemorrhoids, Indigestion, Nausea, Rectal Pain and Vomiting. Female Genitourinary Not Present- Frequency, Nocturia, Painful Urination, Pelvic Pain and Urgency. Musculoskeletal Not Present- Back Pain, Joint Pain, Joint Stiffness, Muscle Pain, Muscle Weakness and Swelling of Extremities. Neurological Not Present- Decreased Memory, Fainting, Headaches, Numbness, Seizures, Tingling, Tremor, Trouble walking and Weakness. Psychiatric Not Present- Anxiety, Bipolar, Change in Sleep Pattern, Depression, Fearful and Frequent crying. Endocrine Not Present- Cold Intolerance, Excessive Hunger, Hair Changes, Heat Intolerance, Hot flashes and New Diabetes. Hematology Not Present-  Blood Thinners, Easy Bruising, Excessive bleeding, Gland problems, HIV and Persistent Infections.  Vitals 20:10 Dockery LPN; Rachel [name] 0/71/2197 AM) 03/21/2019 10:23 AM Weight: 195.8 lb Height: 64in  Body Surface Area: 1.94 m Body Mass Index: 33.61 kg/m  Temp.: 97.33F (Temporal)  Pulse: 101 (Regular)  BP: 116/72(Sitting, Left Arm, Standard)       Physical Exam Rachel Key K. Dorsey Authement MD; 03/21/2019 11:11 AM) The physical exam findings are as follows: Note: WDWN in NAD Eyes: Pupils equal, round; sclera anicteric HENT: Oral mucosa moist; good dentition Neck: No masses palpated, no thyromegaly Lungs: CTA bilaterally; normal respiratory effort CV: Regular rate and rhythm; no murmurs; extremities well-perfused with no edema Abd: +bowel sounds, soft, non-tender, mildly distended. Small palpable umbilical hernia that is easily reducible. Skin: Warm, dry; no sign of jaundice Psychiatric - alert and oriented x 4; calm mood and affect    Assessment & Plan Rachel Key K. Nioka Thorington MD; 7/61/6073 71:06 AM)  UMBILICAL HERNIA WITHOUT OBSTRUCTION OR GANGRENE (K42.9)  Current Plans Schedule for Surgery - the patient will require umbilical hernia repair. We will not use mesh as the patient plans on pregnancy in the near future. Before scheduling surgery, we will rule out gallbladder disease with an abdominal ultrasound. If this is negative we will proceed with primary umbilical hernia repair. The surgical procedure has been discussed with the patient. Potential risks, benefits, alternative treatments, and expected outcomes have been explained. All of the patient's questions at this time have been answered. The likelihood of reaching the patient's treatment goal is good. The patient understand the proposed surgical procedure and wishes to proceed.  Since the patient is planning on getting pregnant fairly soon, we will probably do a primary repair as opposed to mesh. She understands that there  may be a increased risk of recurrence with pregnancy and a primary hernia repair.  Imogene Burn. Georgette Dover, MD, [place] [facility] [phone]  04/02/2019 5:00 PM

## 2019-05-08 DIAGNOSIS — Z319 Encounter for procreative management, unspecified: Secondary | ICD-10-CM | POA: Diagnosis not present

## 2020-04-16 IMAGING — US US ABDOMEN COMPLETE
1 series · 13 of 25 positions shown · non-contrast
Comparison: CT abdomen and pelvis December 23, 2009

CLINICAL DATA: Postprandial pain and bloating

EXAM:
ABDOMEN ULTRASOUND COMPLETE

[Series 1: us abdomen complete · 0.20mm/px · 13 of 81 slices shown]
[im 1/81]
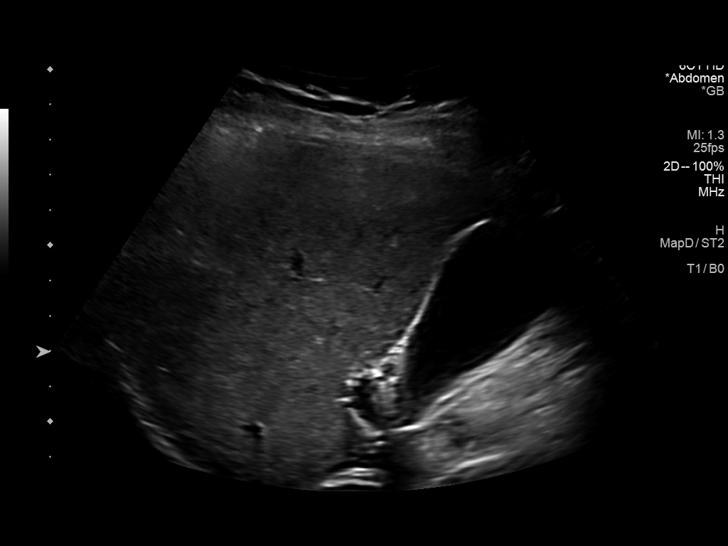
[im 7/81]
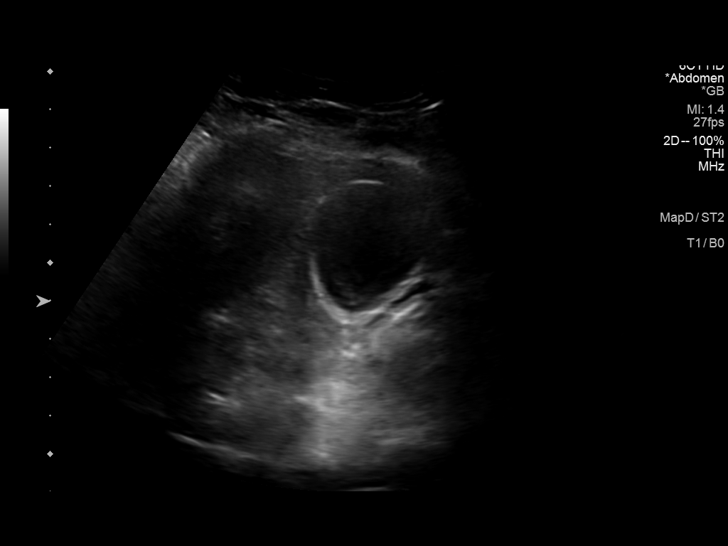
[im 14/81]
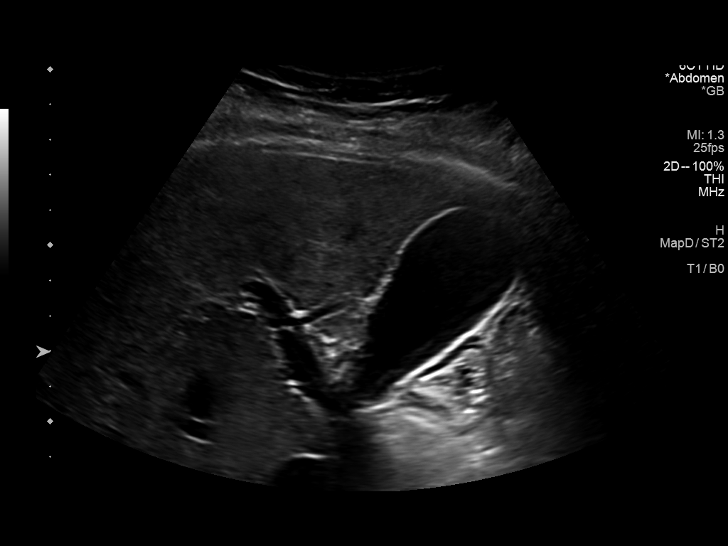
[im 21/81]
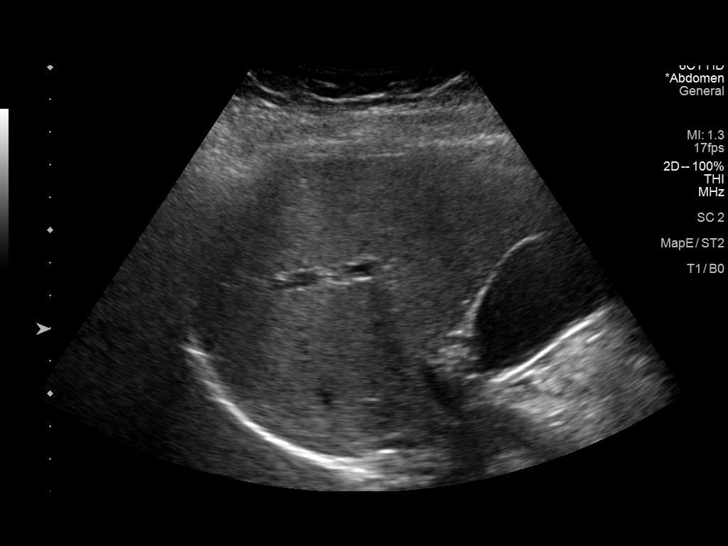
[im 27/81]
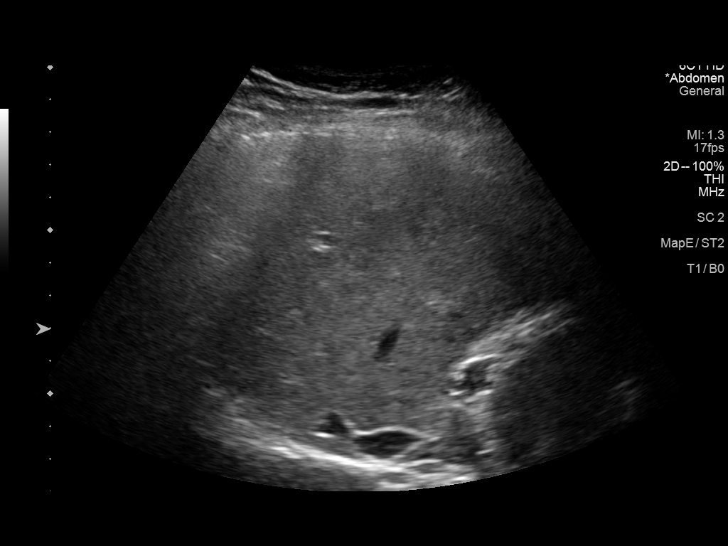
[im 34/81]
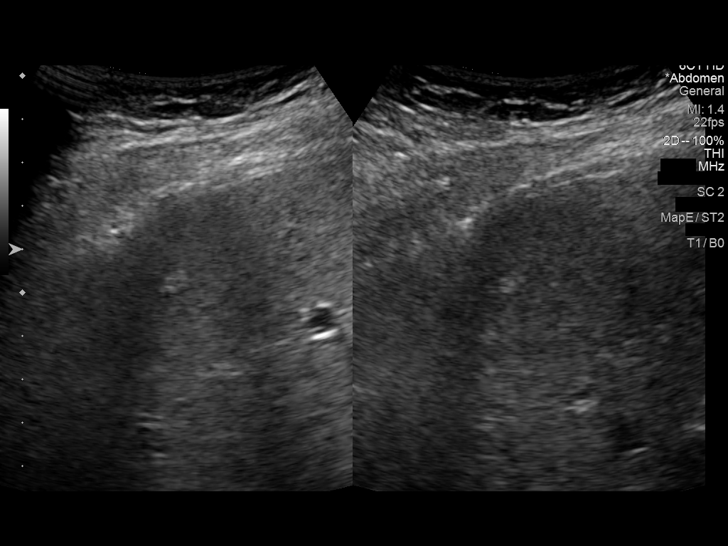
[im 41/81]
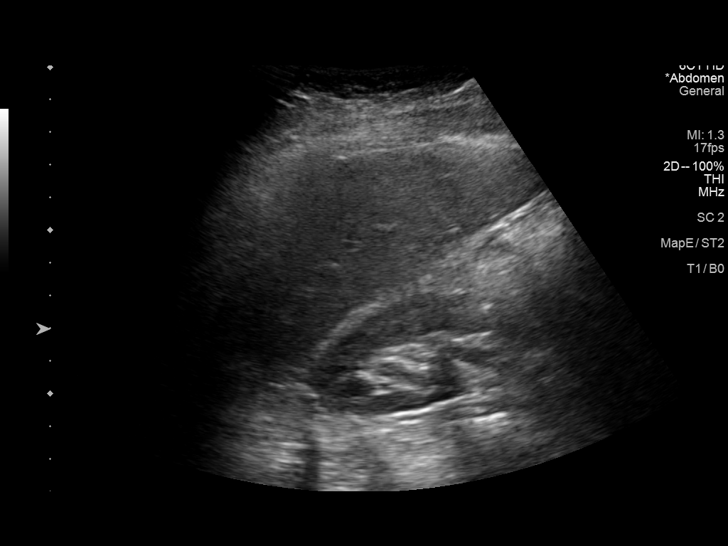
[im 47/81]
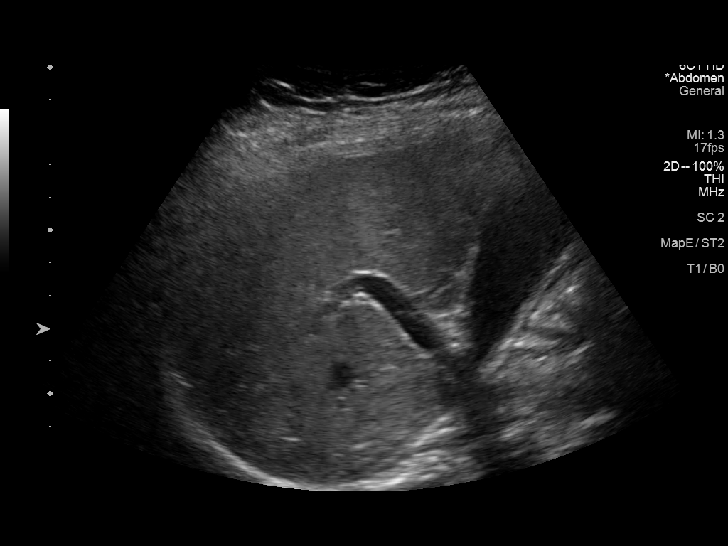
[im 54/81]
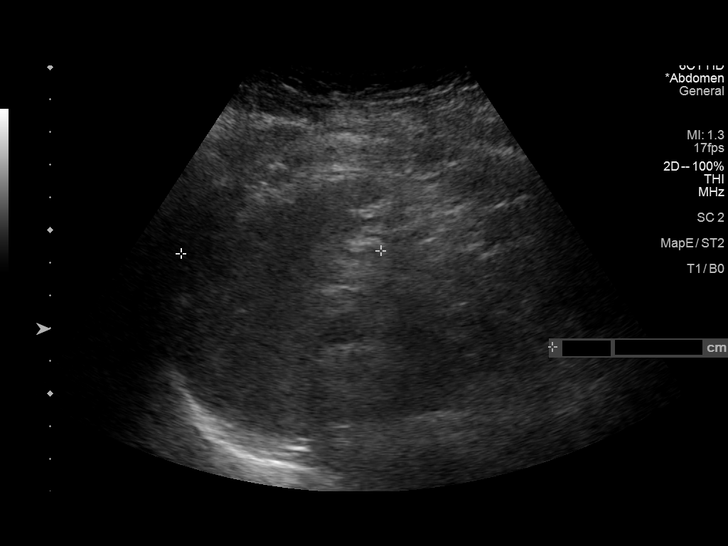
[im 61/81]
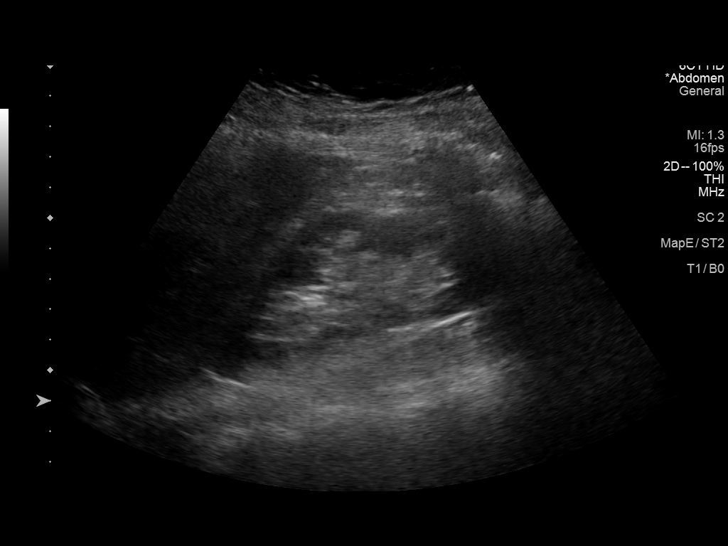
[im 67/81]
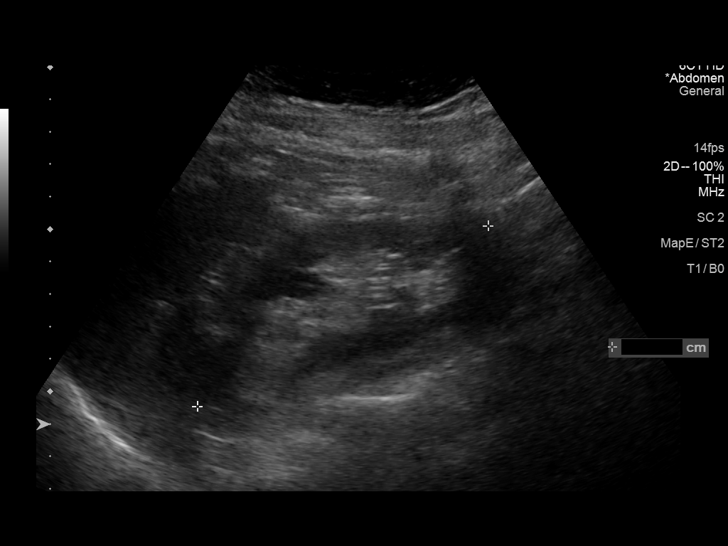
[im 74/81]
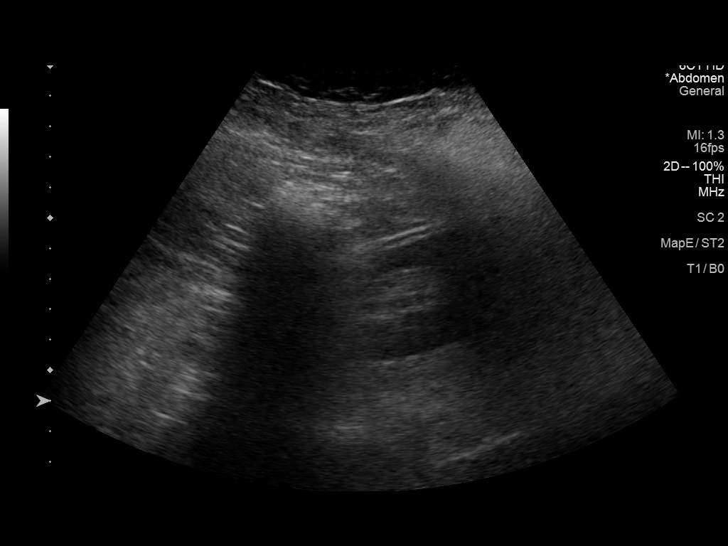
[im 81/81]
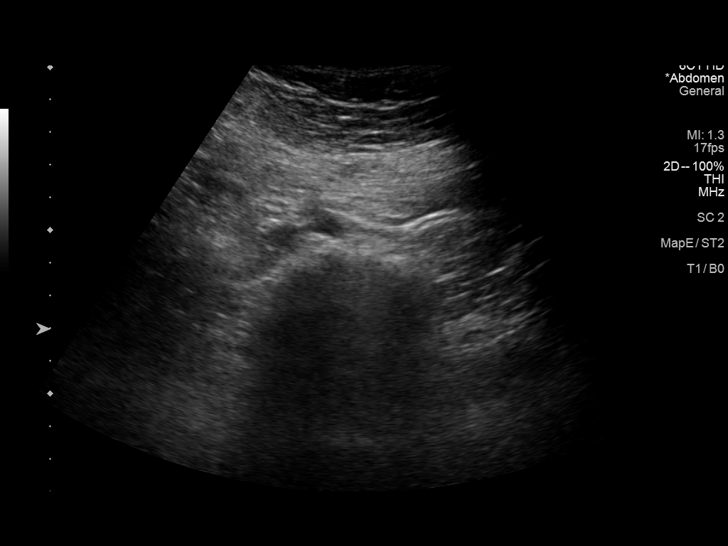

[13 of 25 positions shown; findings below may reference images not displayed]

FINDINGS: Gallbladder: No gallstones or wall thickening visualized. There is
no pericholecystic fluid. No sonographic Murphy sign noted by
sonographer.

Common bile duct: Diameter: 3 mm. No intrahepatic, common hepatic,
or common bile duct dilatation.

Liver: There is a 6 x 6 x 6 mm hyperechoic focus in the anterior
segment right lobe of the liver. Within normal limits in parenchymal
echogenicity. Portal vein is patent on color Doppler imaging with
normal direction of blood flow towards the liver.

IVC: No abnormality visualized.

Pancreas: Visualized portion unremarkable. Portions of pancreas
obscured by gas.

Spleen: Size and appearance within normal limits.

Right Kidney: Length: 11.6 cm. Echogenicity within normal limits. No
mass or hydronephrosis visualized.

Left Kidney: Length: 10.5 cm. Echogenicity within normal limits. No
mass or hydronephrosis visualized.

Abdominal aorta: No aneurysm visualized.

Other findings: No demonstrable ascites.
IMPRESSION: 1. 6 x 6 x 6 mm echogenic focus in the right lobe of the liver, a
suspected small hemangioma. As this structure is not been seen
previously, a follow-up ultrasound of the liver in 1 year to confirm
stability would be advised. Liver otherwise appears unremarkable.

2. Portions of pancreas obscured by gas. Visualized portions of
pancreas appear normal.

3.  Study otherwise unremarkable.

## 2020-04-21 DIAGNOSIS — N926 Irregular menstruation, unspecified: Secondary | ICD-10-CM | POA: Diagnosis not present

## 2020-04-21 DIAGNOSIS — Z01419 Encounter for gynecological examination (general) (routine) without abnormal findings: Secondary | ICD-10-CM | POA: Diagnosis not present

## 2020-04-21 DIAGNOSIS — Z6834 Body mass index (BMI) 34.0-34.9, adult: Secondary | ICD-10-CM | POA: Diagnosis not present

## 2020-04-21 DIAGNOSIS — N644 Mastodynia: Secondary | ICD-10-CM | POA: Diagnosis not present

## 2020-04-22 DIAGNOSIS — N926 Irregular menstruation, unspecified: Secondary | ICD-10-CM | POA: Diagnosis not present

## 2020-04-22 DIAGNOSIS — Z0000 Encounter for general adult medical examination without abnormal findings: Secondary | ICD-10-CM | POA: Diagnosis not present

## 2020-04-22 DIAGNOSIS — Z131 Encounter for screening for diabetes mellitus: Secondary | ICD-10-CM | POA: Diagnosis not present

## 2020-04-22 DIAGNOSIS — Z13220 Encounter for screening for lipoid disorders: Secondary | ICD-10-CM | POA: Diagnosis not present

## 2020-04-22 DIAGNOSIS — Z130 Encounter for screening for diseases of the blood and blood-forming organs and certain disorders involving the immune mechanism: Secondary | ICD-10-CM | POA: Diagnosis not present

## 2020-04-22 DIAGNOSIS — Z1329 Encounter for screening for other suspected endocrine disorder: Secondary | ICD-10-CM | POA: Diagnosis not present

## 2020-05-10 DIAGNOSIS — R109 Unspecified abdominal pain: Secondary | ICD-10-CM | POA: Diagnosis not present

## 2020-05-10 DIAGNOSIS — N6489 Other specified disorders of breast: Secondary | ICD-10-CM | POA: Diagnosis not present

## 2020-05-10 DIAGNOSIS — R928 Other abnormal and inconclusive findings on diagnostic imaging of breast: Secondary | ICD-10-CM | POA: Diagnosis not present

## 2020-05-10 DIAGNOSIS — K429 Umbilical hernia without obstruction or gangrene: Secondary | ICD-10-CM | POA: Diagnosis not present

## 2020-08-06 ENCOUNTER — Other Ambulatory Visit: Payer: Self-pay

## 2020-08-06 ENCOUNTER — Other Ambulatory Visit: Payer: Self-pay | Admitting: Family Medicine

## 2020-08-06 ENCOUNTER — Ambulatory Visit
Admission: RE | Admit: 2020-08-06 | Discharge: 2020-08-06 | Disposition: A | Discharge status: Home / Self Care | Payer: BC Managed Care – PPO | Source: Ambulatory Visit | Attending: Family Medicine | Admitting: Family Medicine

## 2020-08-06 DIAGNOSIS — U071 COVID-19: Secondary | ICD-10-CM | POA: Diagnosis not present

## 2020-08-06 DIAGNOSIS — R059 Cough, unspecified: Secondary | ICD-10-CM | POA: Diagnosis not present

## 2020-08-06 DIAGNOSIS — R0602 Shortness of breath: Secondary | ICD-10-CM | POA: Diagnosis not present

## 2020-10-11 DIAGNOSIS — R251 Tremor, unspecified: Secondary | ICD-10-CM | POA: Diagnosis not present

## 2020-10-11 DIAGNOSIS — R209 Unspecified disturbances of skin sensation: Secondary | ICD-10-CM | POA: Diagnosis not present

## 2020-12-20 ENCOUNTER — Encounter: Payer: Self-pay | Admitting: Neurology

## 2020-12-20 ENCOUNTER — Ambulatory Visit (INDEPENDENT_AMBULATORY_CARE_PROVIDER_SITE_OTHER): Payer: BC Managed Care – PPO | Admitting: Neurology

## 2020-12-20 VITALS — BP 112/82 | HR 92 | Ht 64.0 in | Wt 202.3 lb

## 2020-12-20 DIAGNOSIS — E66811 Obesity, class 1: Secondary | ICD-10-CM

## 2020-12-20 DIAGNOSIS — R253 Fasciculation: Secondary | ICD-10-CM

## 2020-12-20 DIAGNOSIS — R0683 Snoring: Secondary | ICD-10-CM

## 2020-12-20 DIAGNOSIS — G479 Sleep disorder, unspecified: Secondary | ICD-10-CM

## 2020-12-20 DIAGNOSIS — R251 Tremor, unspecified: Secondary | ICD-10-CM | POA: Diagnosis not present

## 2020-12-20 DIAGNOSIS — E669 Obesity, unspecified: Secondary | ICD-10-CM

## 2020-12-20 DIAGNOSIS — F419 Anxiety disorder, unspecified: Secondary | ICD-10-CM | POA: Diagnosis not present

## 2020-12-20 DIAGNOSIS — G4719 Other hypersomnia: Secondary | ICD-10-CM

## 2020-12-20 DIAGNOSIS — R519 Headache, unspecified: Secondary | ICD-10-CM

## 2020-12-20 NOTE — Patient Instructions (Signed)
Your neurological exam is normal thankfully.  Nevertheless, I do have some suggestions for further work-up for your muscle twitching and tremors.  We will do a brain scan, called MRI and call you with the test results. We will have to schedule you for this on a separate date. This test requires authorization from your insurance, and we will take care of the insurance process.  We will check blood work today and call you with the test results.  We will do a sleep study to look at your sleep disturbance and to help rule out underlying obstructive sleep apnea.  If you have obstructive sleep apnea, you may benefit from treatment with a CPAP or so called AutoPap machine.  We will plan a follow-up after testing and we will keep you posted as to your test results by phone call in the interim.

## 2020-12-20 NOTE — Progress Notes (Signed)
Subjective:    Patient ID: Rachel [name] is a 35 y.o. female.  HPI    Huston FoleySaima Jacorian Golaszewski, MD, PhD [name] [address], [address] [address] [place], [place] [id number]  Dear Dr. Kateri PlummerMorrow,   I saw your patient, Rachel Binningmanda Marut, upon your kind request, in my Neurologic clinic today for initial consultation of her tremors.  The patient is unaccompanied today.  As you know, Ms. Rachel [name] is a 35 year old right-handed woman with an underlying medical history of PCOS, History of ruptured ovarian cyst and obesity, who reports an intermittent tremor affecting primarily the right side of her body, right lower extremity in particular since approximately March 2022.  She has also noticed intermittent twitching in various body parts particularly around the upper arm muscles and proximal thigh muscles, right more than left.  This has also been going on for at least a month or 2.  Symptoms are intermittent, sometimes she feels an internal tremor only, nothing visible.  She had twitching in her right index finger which was noticeable at work.  She also noticed twitching around the left eyelid.  She has not had any sudden onset of one-sided weakness or numbness or tingling or droopy face or slurring of speech but has experienced recurrent headaches including morning headaches.  Once she received her prescription eyeglasses recently her headaches improved.  She has not noticed any obvious trigger but reports increase in anxiety since her miscarriage in 2019 and she also had COVID in January 2022.  She has a stressful job.  She has had several stressors in her life including deaths in her family in the past couple of years.  She does not have a family history of tremors or Parkinson's disease.  She has not fallen.  She does not have a history of migraines.  She does not sleep very well and her husband has reported that she snores.  She denies night to night nocturia. She tries to hydrate well.  She  limits her caffeine to 1 cup of coffee per day.  She drinks alcohol rarely and she is a non-smoker.  She works as a daily regular at the [employer].  She is married and lives with her husband and 36-year-old son. I reviewed your office note from 10/11/2020.  She had no obvious tremor at the time.  She had blood work through your office and I was able to review the results from 10/11/2020: CMP showed normal findings, BUN was 11, creatinine 0.81, AST 14, ALT 16, TSH was normal at 3.38.  CBC with differential showed normal findings with a borderline RBC of 4.19, vitamin B12 was on the low end of the spectrum at 257. Her Epworth sleepiness score is 9 out of 24.  Her Past Medical History Is Significant For: Past Medical History:  Diagnosis Date   PCOS (polycystic ovarian syndrome)    Postpartum care following cesarean delivery (7/18) 01/18/2016   Vaginal Pap smear, abnormal    Vaginal vestibulitis     Her Past Surgical History Is Significant For: Past Surgical History:  Procedure Laterality Date   CESAREAN SECTION N/A 01/18/2016   Procedure: CESAREAN SECTION;  Surgeon: Genia DelMarie-Lyne Lavoie, MD;  Location: [facility];  Service: Obstetrics;  Laterality: N/A;   WISDOM TOOTH EXTRACTION      Her Family History Is Significant For: Family History  Problem Relation Age of Onset   Hypertension Father    Diabetes Paternal Grandmother     Her Social History Is Significant  For: Social History   Socioeconomic History   Marital status: Married    Spouse name: Not on file   Number of children: Not on file   Years of education: Not on file   Highest education level: Not on file  Occupational History   Not on file  Tobacco Use   Smoking status: Never   Smokeless tobacco: Never  Substance and Sexual Activity   Alcohol use: Yes   Drug use: No   Sexual activity: Not on file  Other Topics Concern   Not on file  Social History Narrative   Not on file   Social Determinants of  Health   Financial Resource Strain: Not on file  Food Insecurity: Not on file  Transportation Needs: Not on file  Physical Activity: Not on file  Stress: Not on file  Social Connections: Not on file    Her Allergies Are:  No Known Allergies:   Her Current Medications Are:  Outpatient Encounter Medications as of 12/20/2020  Medication Sig   Chlorpheniramine Maleate (ALLERGY PO) Take by mouth.   ibuprofen (ADVIL,MOTRIN) 600 MG tablet Take 1 tablet (600 mg total) by mouth every 6 (six) hours.   Multiple Vitamin (MULTIVITAMIN) capsule Take 1 capsule by mouth daily.   [DISCONTINUED] ferrous sulfate 325 (65 FE) MG tablet Take 1 tablet (325 mg total) by mouth 2 (two) times daily with a meal.   [DISCONTINUED] magnesium oxide (MAG-OX) 400 (241.3 Mg) MG tablet Take 1 tablet (400 mg total) by mouth daily.   [DISCONTINUED] oxyCODONE (OXY IR/ROXICODONE) 5 MG immediate release tablet Take 1 tablet (5 mg total) by mouth every 4 (four) hours as needed for moderate pain or severe pain.   [DISCONTINUED] Prenatal Vit-Fe Fumarate-FA (PRENATAL MULTIVITAMIN) TABS tablet Take 1 tablet by mouth daily at 12 noon.   No facility-administered encounter medications on file as of 12/20/2020.  :   Review of Systems:  Out of a complete 14 point review of systems, all are reviewed and negative with the exception of these symptoms as listed below:  Review of Systems  Neurological:        Here for consult. Worsening right leg tremor. Pt reports since she had covid early this year and since the tremor has become worse. She also reports internal tremors and shaking and muscle spasm like symptoms. She also reports an increase in h/a and hair loss since having covid back in jan of 2022.   Objective:  Neurological Exam  Physical Exam Physical Examination:   Vitals:   12/20/20 1502  BP: 112/82  Pulse: 92  SpO2: 97%    General Examination: The patient is a very pleasant 35 y.o. female in no acute distress. She  appears well-developed and well-nourished and well groomed.   HEENT: Normocephalic, atraumatic, pupils are equal, round and reactive to light and accommodation. Funduscopic exam is normal with sharp disc margins noted. Extraocular tracking is good without limitation to gaze excursion or nystagmus noted. Normal smooth pursuit is noted. Hearing is grossly intact. Tympanic membranes are clear bilaterally. Face is symmetric with normal facial animation and normal facial sensation. Speech is clear with no dysarthria noted. There is no hypophonia. There is no lip, neck/head, jaw or voice tremor. Neck is supple with full range of passive and active motion. There are no carotid bruits on auscultation. Oropharynx exam reveals: mild mouth dryness, good dental hygiene and mild airway crowding. Mallampati is class II. Tongue protrudes centrally and palate elevates symmetrically. Tonsils are 1+ in size.  Chest: Clear to auscultation without wheezing, rhonchi or crackles noted.  Heart: S1+S2+0, regular and normal without murmurs, rubs or gallops noted.   Abdomen: Soft, non-tender and non-distended with normal bowel sounds appreciated on auscultation.  Extremities: There is no pitting edema in the distal lower extremities bilaterally. Pedal pulses are intact.  Skin: Warm and dry without trophic changes noted. There are no varicose veins.  Musculoskeletal: exam reveals no obvious joint deformities, tenderness or joint swelling or erythema.   Neurologically:  Mental status: The patient is awake, alert and oriented in all 4 spheres. Her immediate and remote memory, attention, language skills and fund of knowledge are appropriate. There is no evidence of aphasia, agnosia, apraxia or anomia. Speech is clear with normal prosody and enunciation. Thought process is linear. Mood is normal and affect is normal.  Cranial nerves II - XII are as described above under HEENT exam. In addition: shoulder shrug is normal with  equal shoulder height noted. Motor exam: Normal bulk, strength and tone is noted. There is no drift, resting tremor or rebound.  She has no significant postural or action tremor in the upper extremities, no intention tremor. No fasciculations and no focal or generalized atrophy seen. Romberg is negative. Reflexes are 2+ throughout. Babinski: Toes are flexor bilaterally. Fine motor skills and coordination: intact with normal finger taps, normal hand movements, normal rapid alternating patting, normal foot taps and normal foot agility.  Cerebellar testing: No dysmetria or intention tremor on finger to nose testing. Heel to shin is unremarkable bilaterally. There is no truncal or gait ataxia.  Sensory exam: intact to light touch, pinprick, vibration, temperature sense in the upper and lower extremities.  Gait, station and balance: She stands easily. No veering to one side is noted. No leaning to one side is noted. Posture is age-appropriate and stance is narrow based. Gait shows normal stride length and normal pace. No problems turning are noted. Tandem walk is unremarkable.    Assessment and Plan:   In summary, Sumi Lye is a very pleasant 35 y.o.-year old female with an underlying medical history of PCOS, History of ruptured ovarian cyst and obesity, who presents for evaluation of her intermittent tremors and also muscle twitching over the past couple of months, first noticed around March 2022.  She has a benign exam today and she is largely reassured.  In particular, no focal atrophy, no fasciculations, no tremor noted, no myoclonus.  She has had some recent increase in stress, also disturbance in her sleep consolidation and sleep quality, increase in anxiety since 2019.  I suggested we proceed with additional evaluation and testing to rule out an organic and treatable cause of her symptoms.  She had blood work through your office but I would like to add some additional blood tests including CK  level, inflammatory and autoimmune markers, vitamin B1 and B6.  We will call her with her test results.  In addition, I suggested we proceed with a brain MRI with and without contrast to rule out a structural cause of her symptoms.  For her sleep-related symptoms including snoring, sleep disturbance, daytime sleepiness, and morning headaches, I would like to proceed with a sleep study.  She is agreeable to this approach and we will proceed with testing with a sleep study as well.  We will call her to schedule her brain MRI and sleep study and also keep her posted as to her test results by phone call.  If she has obstructive sleep apnea, she is  advised to consider treatment with a CPAP or AutoPap machine.  We will pick up our discussion after testing.  I answered all her questions today and she was in agreement with the plan.  She is encouraged to talk to you about management of stress and anxiety.   Thank you very much for allowing me to participate in the care of this nice patient. If I can be of any further assistance to you please do not hesitate to call me at [phone].  Sincerely,   Huston Foley, MD, PhD

## 2020-12-21 ENCOUNTER — Other Ambulatory Visit: Payer: Self-pay | Admitting: Neurology

## 2020-12-21 ENCOUNTER — Other Ambulatory Visit: Payer: Self-pay

## 2020-12-21 ENCOUNTER — Ambulatory Visit: Payer: BC Managed Care – PPO

## 2020-12-21 ENCOUNTER — Telehealth: Payer: Self-pay | Admitting: Neurology

## 2020-12-21 DIAGNOSIS — R253 Fasciculation: Secondary | ICD-10-CM | POA: Diagnosis not present

## 2020-12-21 DIAGNOSIS — E66811 Obesity, class 1: Secondary | ICD-10-CM

## 2020-12-21 DIAGNOSIS — F419 Anxiety disorder, unspecified: Secondary | ICD-10-CM

## 2020-12-21 DIAGNOSIS — R251 Tremor, unspecified: Secondary | ICD-10-CM

## 2020-12-21 DIAGNOSIS — E669 Obesity, unspecified: Secondary | ICD-10-CM

## 2020-12-21 DIAGNOSIS — G4719 Other hypersomnia: Secondary | ICD-10-CM

## 2020-12-21 DIAGNOSIS — R519 Headache, unspecified: Secondary | ICD-10-CM

## 2020-12-21 DIAGNOSIS — G479 Sleep disorder, unspecified: Secondary | ICD-10-CM

## 2020-12-21 DIAGNOSIS — R0683 Snoring: Secondary | ICD-10-CM

## 2020-12-21 NOTE — Telephone Encounter (Addendum)
MRI brain w/wo contrast Yet[place] Numbers: [id number] (exp. 01/19/21)  Scheduled at [facility] 12/21/20 at 1:00 pm

## 2020-12-23 ENCOUNTER — Telehealth: Payer: Self-pay

## 2020-12-23 NOTE — Telephone Encounter (Signed)
Noted, thank you for the clarification.

## 2020-12-23 NOTE — Telephone Encounter (Signed)
-----   Message from Huston Foley, MD sent at 12/23/2020  7:40 AM EDT ----- Please advise patient that her labs were benign, 2 tests are pending which is the vitamin B6 and B1 levels, they usually take a few days longer to come back.  We will update if any of them are abnormal.

## 2020-12-23 NOTE — Telephone Encounter (Signed)
I called pt and we discussed results of MRI. Pt verbalized understanding. She sts MRI was unable to be completed with contrast due to IV access.  Pt sts she was stuck 4 times and access was unable to be obtained.   Pt will proceed with sleep study recommendation, advised once authorization from insurance has been received we will be in touch.

## 2020-12-23 NOTE — Telephone Encounter (Signed)
I called pt and advised of Labs. Pt verbalized understanding.

## 2020-12-23 NOTE — Telephone Encounter (Signed)
-----   Message from Huston Foley, MD sent at 12/23/2020  7:18 AM EDT ----- Please call patient and advise her that her brain MRI without contrast from 12/21/2020 was reported as normal.  I believe I had ordered the MRI originally with contrast but the order was changed to without contrast at the time of her test, I am not sure why.

## 2020-12-24 ENCOUNTER — Ambulatory Visit: Payer: BC Managed Care – PPO | Admitting: Neurology

## 2020-12-24 LAB — MAGNESIUM: Magnesium: 2.2 mg/dL (ref 1.6–2.3)

## 2020-12-24 LAB — VITAMIN B6: Vitamin B6: 19.6 ug/L (ref 3.4–65.2)

## 2020-12-24 LAB — SEDIMENTATION RATE: Sed Rate: 4 mm/hr (ref 0–32)

## 2020-12-24 LAB — RPR: RPR Ser Ql: NONREACTIVE

## 2020-12-24 LAB — HGB A1C W/O EAG: Hgb A1c MFr Bld: 5.5 % (ref 4.8–5.6)

## 2020-12-24 LAB — VITAMIN B1: Thiamine: 107.1 nmol/L (ref 66.5–200.0)

## 2020-12-24 LAB — ANA W/REFLEX: Anti Nuclear Antibody (ANA): NEGATIVE

## 2020-12-24 LAB — VITAMIN D 25 HYDROXY (VIT D DEFICIENCY, FRACTURES): Vit D, 25-Hydroxy: 35 ng/mL (ref 30.0–100.0)

## 2020-12-24 LAB — CK: Total CK: 139 U/L (ref 32–182)

## 2020-12-27 ENCOUNTER — Telehealth: Payer: Self-pay

## 2020-12-27 NOTE — Telephone Encounter (Signed)
LVM for pt to call me back to schedule sleep study  

## 2021-01-11 DIAGNOSIS — R251 Tremor, unspecified: Secondary | ICD-10-CM | POA: Diagnosis not present

## 2021-01-11 DIAGNOSIS — L659 Nonscarring hair loss, unspecified: Secondary | ICD-10-CM | POA: Diagnosis not present

## 2021-01-11 DIAGNOSIS — Z658 Other specified problems related to psychosocial circumstances: Secondary | ICD-10-CM | POA: Diagnosis not present

## 2021-01-12 ENCOUNTER — Ambulatory Visit (INDEPENDENT_AMBULATORY_CARE_PROVIDER_SITE_OTHER): Payer: BC Managed Care – PPO | Admitting: Neurology

## 2021-01-12 DIAGNOSIS — R0683 Snoring: Secondary | ICD-10-CM

## 2021-01-12 DIAGNOSIS — F419 Anxiety disorder, unspecified: Secondary | ICD-10-CM

## 2021-01-12 DIAGNOSIS — G479 Sleep disorder, unspecified: Secondary | ICD-10-CM

## 2021-01-12 DIAGNOSIS — R253 Fasciculation: Secondary | ICD-10-CM

## 2021-01-12 DIAGNOSIS — E669 Obesity, unspecified: Secondary | ICD-10-CM

## 2021-01-12 DIAGNOSIS — R519 Headache, unspecified: Secondary | ICD-10-CM

## 2021-01-12 DIAGNOSIS — R251 Tremor, unspecified: Secondary | ICD-10-CM | POA: Diagnosis not present

## 2021-01-12 DIAGNOSIS — E66811 Obesity, class 1: Secondary | ICD-10-CM

## 2021-01-12 DIAGNOSIS — G4719 Other hypersomnia: Secondary | ICD-10-CM

## 2021-01-20 NOTE — Progress Notes (Signed)
See procedure note.

## 2021-01-21 ENCOUNTER — Encounter: Payer: Self-pay | Admitting: Neurology

## 2021-01-24 NOTE — Procedures (Signed)
   [facility]  HOME SLEEP TEST (Watch PAT) REPORT  STUDY DATE: 01/12/2021  DOB: Apr 10, 1986  MRN: [id number]  ORDERING CLINICIAN: Huston Foley, MD, PhD   REFERRING CLINICIAN: Dr. Nyoka Cowden  CLINICAL INFORMATION/HISTORY: 35 year old woman with an underlying medical history of PCOS, History of ruptured ovarian cyst and obesity, who reports an intermittent tremor affecting primarily the right side of her body.  She does not sleep very well and her husband has reported that she snores.  BMI: 34.7 kg/m  FINDINGS:   Sleep Summary:   Total Recording Time (hours, min): 8 hours, 10 minutes  Total Sleep Time (hours, min):  7 hours, 24 minutes   Percent REM (%):    24.2%   Respiratory Indices:   Calculated pAHI (per hour):  4.2/hour         REM pAHI:    9.1/hour       NREM pAHI: 2.7/hour  Oxygen Saturation Statistics:    Oxygen Saturation (%) Mean: 95%   Minimum oxygen saturation (%):                 87%   O2 Saturation Range (%): 87-98%    O2 Saturation (minutes) <=88%: 0 min  Pulse Rate Statistics:   Pulse Mean (bpm):    67/min    Pulse Range (51-121/min)   IMPRESSION: Primary snoring  RECOMMENDATION:  This home sleep test does not demonstrate any significant obstructive or central sleep disordered breathing.  Snoring was noted and appeared to be intermittent, in the mild to moderate range.  Other causes of the patient's symptoms, including circadian rhythm disturbances, an underlying mood disorder, medication effect and/or an underlying medical problem cannot be ruled out based on this test. Clinical correlation is recommended. The patient should be cautioned not to drive, work at heights, or operate dangerous or heavy equipment when tired or sleepy. Review and reiteration of good sleep hygiene measures should be pursued with any patient. The patient can follow up with her referring provider, who will be notified of the test results. An appointment in  sleep clinic can be made as necessary.   I certify that I have reviewed the raw data recording prior to the issuance of this report in accordance with the standards of the American Academy of Sleep Medicine (AASM).  INTERPRETING PHYSICIAN:   Huston Foley, MD, PhD  Board Certified in Neurology and Sleep Medicine  [facility] [address], [address] [place], [place] [address] [phone]

## 2021-01-26 ENCOUNTER — Telehealth: Payer: Self-pay | Admitting: *Deleted

## 2021-01-26 NOTE — Telephone Encounter (Signed)
I sent mychart message as well as LMVM for her to return call for sleep study results.

## 2021-01-26 NOTE — Telephone Encounter (Signed)
-----   Message from Huston Foley, MD sent at 01/24/2021  5:34 PM EDT ----- Patient referred by Dr. Kateri Plummer for intermittent tremors, seen by me on 12/20/2020, HST on 01/12/2021.   Please call and notify the patient that the recent home sleep test did not show any significant obstructive sleep apnea.  She had intermittent snoring which was detected in the mild-to-moderate range.  At this juncture, I recommend that she follow-up with her primary care and we can see her back in neurology clinic on an as-needed basis at this point.  Thanks,  Huston Foley, MD, PhD [facility])

## 2021-04-14 DIAGNOSIS — M62838 Other muscle spasm: Secondary | ICD-10-CM | POA: Diagnosis not present

## 2021-04-25 DIAGNOSIS — Z658 Other specified problems related to psychosocial circumstances: Secondary | ICD-10-CM | POA: Diagnosis not present

## 2021-04-25 DIAGNOSIS — R209 Unspecified disturbances of skin sensation: Secondary | ICD-10-CM | POA: Diagnosis not present

## 2021-04-25 DIAGNOSIS — M542 Cervicalgia: Secondary | ICD-10-CM | POA: Diagnosis not present

## 2021-05-10 ENCOUNTER — Other Ambulatory Visit: Payer: Self-pay

## 2021-05-10 ENCOUNTER — Emergency Department (INDEPENDENT_AMBULATORY_CARE_PROVIDER_SITE_OTHER)
Admission: EM | Admit: 2021-05-10 | Discharge: 2021-05-10 | Disposition: A | Discharge status: Home / Self Care | Payer: BC Managed Care – PPO | Source: Home / Self Care | Attending: Family Medicine | Admitting: Family Medicine

## 2021-05-10 DIAGNOSIS — J111 Influenza due to unidentified influenza virus with other respiratory manifestations: Secondary | ICD-10-CM | POA: Diagnosis not present

## 2021-05-10 MED ORDER — OSELTAMIVIR PHOSPHATE 75 MG PO CAPS: 75.0000 mg | ORAL_CAPSULE | Freq: Two times a day (BID) | ORAL | 0 refills | Status: DC

## 2021-05-10 MED ORDER — ONDANSETRON HCL 8 MG PO TABS: 8.0000 mg | ORAL_TABLET | Freq: Three times a day (TID) | ORAL | 0 refills | Status: DC | PRN

## 2021-05-10 NOTE — ED Provider Notes (Signed)
Rachel [name] [facility]    CSN: [id number] Arrival date & time: 05/10/21  1639      History   Chief Complaint Chief Complaint  Patient presents with   Cough    Pt states that she has a cough, stuffy nose, and fever. X2 days.     HPI Rachel [name] is a 35 y.o. female.   HPI Husband and son both had influenza a few days ago, she has had some symptoms since yesterday.  Runny and stuffy nose, harsh cough, fatigue and body aches.  She is only had a low-grade fever.  She understands that this is likely influenza.  Past Medical History:  Diagnosis Date   PCOS (polycystic ovarian syndrome)    Postpartum care following cesarean delivery (7/18) 01/18/2016   Vaginal Pap smear, abnormal    Vaginal vestibulitis     Patient Active Problem List   Diagnosis Date Noted   Postoperative state 01/18/2016   Postpartum care following cesarean delivery (7/18) 01/18/2016   Pregnancy 01/17/2016    Past Surgical History:  Procedure Laterality Date   CESAREAN SECTION N/A 01/18/2016   Procedure: CESAREAN SECTION;  Surgeon: Genia Del, MD;  Location: [facility];  Service: Obstetrics;  Laterality: N/A;   WISDOM TOOTH EXTRACTION      OB History     Gravida  2   Para  1   Term  1   Preterm      AB  1   Living  1      SAB  1   IAB      Ectopic      Multiple  0   Live Births  1            Home Medications    Prior to Admission medications   Medication Sig Start Date End Date Taking? Authorizing Provider  Cyanocobalamin (VITAMIN B12 PO) Take by mouth.   Yes [provider]  ibuprofen (ADVIL,MOTRIN) 600 MG tablet Take 1 tablet (600 mg total) by mouth every 6 (six) hours. 01/20/16  Yes Raelyn Mora, CNM  Multiple Vitamin (MULTIVITAMIN) capsule Take 1 capsule by mouth daily.   Yes [provider]  ondansetron (ZOFRAN) 8 MG tablet Take 1 tablet (8 mg total) by mouth every 8 (eight) hours as needed for nausea or vomiting. 05/10/21  Yes  Eustace Moore, MD  oseltamivir (TAMIFLU) 75 MG capsule Take 1 capsule (75 mg total) by mouth every 12 (twelve) hours. 05/10/21  Yes Eustace Moore, MD  Chlorpheniramine Maleate (ALLERGY PO) Take by mouth.    [provider]    Family History Family History  Problem Relation Age of Onset   Hypertension Father    Diabetes Paternal Grandmother     Social History Social History   Tobacco Use   Smoking status: Never   Smokeless tobacco: Never  Substance Use Topics   Alcohol use: Yes   Drug use: No     Allergies   Dextromethorphan-guaifenesin   Review of Systems Review of Systems See HPI  Physical Exam Triage Vital Signs ED Triage Vitals  Enc Vitals Group     BP 05/10/21 1729 122/87     Pulse Rate 05/10/21 1729 85     Resp 05/10/21 1729 18     Temp 05/10/21 1729 98.1 F (36.7 C)     Temp Source 05/10/21 1729 Oral     SpO2 05/10/21 1729 99 %     Weight 05/10/21 1724 191 lb (86.6 kg)  Height 05/10/21 1724 5\' 4"  (1.626 m)     Head Circumference --      Peak Flow --      Pain Score 05/10/21 1723 0     Pain Loc --      Pain Edu? --      Excl. in GC? --    No data found.  Updated Vital Signs BP 122/87 (BP Location: Left Arm)   Pulse 85   Temp 98.1 F (36.7 C) (Oral)   Resp 18   Ht 5\' 4"  (1.626 m)   Wt 86.6 kg   LMP 03/19/2021   SpO2 99%   BMI 32.79 kg/m      Physical Exam Constitutional:      General: She is not in acute distress.    Appearance: She is well-developed. She is ill-appearing.  HENT:     Head: Normocephalic and atraumatic.     Right Ear: Tympanic membrane and ear canal normal.     Left Ear: Tympanic membrane and ear canal normal.     Nose: Congestion present.     Mouth/Throat:     Pharynx: Posterior oropharyngeal erythema present.     Comments: Mild erythema post pharynx Eyes:     Conjunctiva/sclera: Conjunctivae normal.     Pupils: Pupils are equal, round, and reactive to light.  Cardiovascular:     Rate and  Rhythm: Normal rate and regular rhythm.  Pulmonary:     Effort: Pulmonary effort is normal. No respiratory distress.     Breath sounds: Normal breath sounds.  Abdominal:     General: There is no distension.     Palpations: Abdomen is soft.  Musculoskeletal:        General: Normal range of motion.     Cervical back: Normal range of motion.  Skin:    General: Skin is warm and dry.  Neurological:     Mental Status: She is alert.     UC Treatments / Results  Labs (all labs ordered are listed, but only abnormal results are displayed) Labs Reviewed - No data to display  EKG   Radiology No results found.  Procedures Procedures (including critical care time)  Medications Ordered in UC Medications - No data to display  Initial Impression / Assessment and Plan / UC Course  I have reviewed the triage vital signs and the nursing notes.  Pertinent labs & imaging results that were available during my care of the patient were reviewed by me and considered in my medical decision making (see chart for details).      Final Clinical Impressions(s) / UC Diagnoses   Final diagnoses:  Influenza     Discharge Instructions      I have prescribed Tamiflu.  Is 1 pill 2 times a day for 5 days.  You may stop early if it is ineffective or causes side effects I have prescribed Zofran.  Take this as needed for any nausea or vomiting Drink lots of fluids.  Get lots of rest May take Tylenol, Advil, or Aleve for pain and fever May return to activity when you have any improvement in your symptoms and no fever for 24 hours   ED Prescriptions     Medication Sig Dispense Auth. Provider   oseltamivir (TAMIFLU) 75 MG capsule Take 1 capsule (75 mg total) by mouth every 12 (twelve) hours. 10 capsule , MD   ondansetron (ZOFRAN) 8 MG tablet Take 1 tablet (8 mg total) by mouth every 8 (eight)  hours as needed for nausea or vomiting. 20 tablet Eustace Moore, MD      PDMP not  reviewed this encounter.   Eustace Moore, MD 05/12/21 [phone]

## 2021-05-10 NOTE — Discharge Instructions (Signed)
I have prescribed Tamiflu.  Is 1 pill 2 times a day for 5 days.  You may stop early if it is ineffective or causes side effects I have prescribed Zofran.  Take this as needed for any nausea or vomiting Drink lots of fluids.  Get lots of rest May take Tylenol, Advil, or Aleve for pain and fever May return to activity when you have any improvement in your symptoms and no fever for 24 hours

## 2021-05-10 NOTE — ED Triage Notes (Signed)
Pt states that she has a cough, stuffy nose,  and fever. X2 days  Pt states that she is vaccinated.  Pt states that she was exposed to flu.

## 2021-09-15 ENCOUNTER — Encounter: Payer: Self-pay | Admitting: *Deleted

## 2021-09-19 ENCOUNTER — Other Ambulatory Visit: Payer: Self-pay

## 2021-09-19 ENCOUNTER — Ambulatory Visit (INDEPENDENT_AMBULATORY_CARE_PROVIDER_SITE_OTHER): Payer: 59 | Admitting: Neurology

## 2021-09-19 ENCOUNTER — Encounter: Payer: Self-pay | Admitting: Neurology

## 2021-09-19 VITALS — BP 111/76 | HR 78 | Ht 64.0 in | Wt 181.6 lb

## 2021-09-19 DIAGNOSIS — R253 Fasciculation: Secondary | ICD-10-CM | POA: Diagnosis not present

## 2021-09-19 DIAGNOSIS — G514 Facial myokymia: Secondary | ICD-10-CM | POA: Diagnosis not present

## 2021-09-19 DIAGNOSIS — R251 Tremor, unspecified: Secondary | ICD-10-CM | POA: Diagnosis not present

## 2021-09-19 DIAGNOSIS — R259 Unspecified abnormal involuntary movements: Secondary | ICD-10-CM | POA: Diagnosis not present

## 2021-09-19 NOTE — Progress Notes (Signed)
Subjective:  ?  ?Patient ID: Rachel [name] is a 36 y.o. female. ? ?HPI ? ? ? ?Interim history:  ? ?Rachel [name] is a 36 year old right-handed woman with an underlying medical history of PCOS, History of ruptured ovarian cyst and obesity, who presents for follow-up consultation of her intermittent tremors.  The patient is unaccompanied today, and is re-referred by her primary care physician.  She saw him on 08/01/2021, at which time she reported intermittent tremors and a sense of internal shaking, some spasms.  She was on a B12 supplement for borderline low B12 levels.  She was working with weight management and reported significant weight loss.  I first met her at the request of her primary care physician on 12/20/2020, at which time she reported tremors affecting her right side of her body, particularly her right lower extremity since approximately March 2022.  She reported significant stress, she was also not sleeping very well.  I suggested we proceed with further evaluation with a brain MRI and sleep study.  We also did extensive blood work which was benign.  Her brain MRI was done without contrast because of lack of access at the time.  Her brain MRI without contrast on 12/21/2020 showed benign findings, I reviewed the results: IMPRESSION:  ?  ?Normal MRI brain (without).  ?  ?Had a home sleep test on 01/12/2021 which did not show any significant obstructive sleep apnea.  She had mild to moderate intermittent snoring.  Her overall AHI was 4.2/h, O2 nadir 87% briefly. ? ?Today, 09/19/2021: she reports ongoing issues with intermittent twitching in different areas of her body including left thumb or right shoulder area or a feeling of inner trembling or an abnormal feeling like bubbles are popping underneath her skin, she has no numbness, no actual pins-and-needles sensation, no weakness, feels that she has always been clumsy, feels that her balance has not been as good, also has had some difficulty concentrating and  remembering recent conversations.  She has not fallen but had 1 recent near fall.  She does endorse stress, she has been on Lexapro which has been helpful for her anxiety.   ?She had blood work with multiple normal 32,023 and I was able to review the results: TSH was 2.15, CBC with differential showed a mildly low RBC of 3.93, hemoglobin borderline at 12, hematocrit mildly low at 35.4. ?Her vitamin B12 was low on 04/25/2021 at 204.  It had increased to above 500 but has been fluctuating some.  She has not lost any consciousness or awareness, she does not have any sustained shaking or convulsion, she has had involuntary movements in her arms or legs at times, usually just 1 short jerking-like movement.  She would be willing to repeat her brain MRI with contrast.  She has had intermittent facial twitching as well. ? ?The patient's allergies, current medications, family history, past medical history, past social history, past surgical history and problem list were reviewed and updated as appropriate.  ? ?Previously:  ? ?12/20/20: (She) reports an intermittent tremor affecting primarily the right side of her body, right lower extremity in particular since approximately March 2022.  She has also noticed intermittent twitching in various body parts particularly around the upper arm muscles and proximal thigh muscles, right more than left.  This has also been going on for at least a month or 2.  Symptoms are intermittent, sometimes she feels an internal tremor only, nothing visible.  She had twitching in her right index finger which  was noticeable at work.  She also noticed twitching around the left eyelid.  She has not had any sudden onset of one-sided weakness or numbness or tingling or droopy face or slurring of speech but has experienced recurrent headaches including morning headaches.  Once she received her prescription eyeglasses recently her headaches improved.  She has not noticed any obvious trigger but reports  increase in anxiety since her miscarriage in 2019 and she also had COVID in January 2022.  She has a stressful job.  She has had several stressors in her life including deaths in her family in the past couple of years.  She does not have a family history of tremors or Parkinson's disease.  She has not fallen.  She does not have a history of migraines.  She does not sleep very well and her husband has reported that she snores.  She denies night to night nocturia. ?She tries to hydrate well.  She limits her caffeine to 1 cup of coffee per day.  She drinks alcohol rarely and she is a non-smoker.  She works as a daily regular at the [employer].  She is married and lives with her husband and 15-year-old son. ?I reviewed your office note from 10/11/2020.  She had no obvious tremor at the time.  She had blood work through your office and I was able to review the results from 10/11/2020: CMP showed normal findings, BUN was 11, creatinine 0.81, AST 14, ALT 16, TSH was normal at 3.38.  CBC with differential showed normal findings with a borderline RBC of 4.19, vitamin B12 was on the low end of the spectrum at 257. ?Her Epworth sleepiness score is 9 out of 24. ? ?Her Past Medical History Is Significant For: ?Past Medical History:  ?Diagnosis Date  ? PCOS (polycystic ovarian syndrome)   ? Postpartum care following cesarean delivery (7/18) 01/18/2016  ? Ruptured ovarian cyst   ? Vaginal Pap smear, abnormal   ? Vaginal vestibulitis   ? ? ?Her Past Surgical History Is Significant For: ?Past Surgical History:  ?Procedure Laterality Date  ? CESAREAN SECTION N/A 01/18/2016  ? Procedure: CESAREAN SECTION;  Surgeon: Princess Bruins, MD;  Location: [facility];  Service: Obstetrics;  Laterality: N/A;  ? WISDOM TOOTH EXTRACTION    ? ? ?Her Family History Is Significant For: ?Family History  ?Problem Relation Age of Onset  ? Hypertension Father   ? Diabetes Paternal Grandmother   ? ? ?Her Social History Is Significant  For: ?Social History  ? ?Socioeconomic History  ? Marital status: Married  ?  Spouse name: Not on file  ? Number of children: Not on file  ? Years of education: Not on file  ? Highest education level: Not on file  ?Occupational History  ? Not on file  ?Tobacco Use  ? Smoking status: Never  ? Smokeless tobacco: Never  ?Substance and Sexual Activity  ? Alcohol use: Yes  ? Drug use: No  ? Sexual activity: Not on file  ?Other Topics Concern  ? Not on file  ?Social History Narrative  ? Not on file  ? ?Social Determinants of Health  ? ?Financial Resource Strain: Not on file  ?Food Insecurity: Not on file  ?Transportation Needs: Not on file  ?Physical Activity: Not on file  ?Stress: Not on file  ?Social Connections: Not on file  ? ? ?Her Allergies Are:  ?Allergies  ?Allergen Reactions  ? Dextromethorphan-Guaifenesin Nausea And Vomiting  ?:  ? ?Her Current  Medications Are:  ?Outpatient Encounter Medications as of 09/19/2021  ?Medication Sig  ? Cyanocobalamin (VITAMIN B12 PO) Take 1,000 mg by mouth daily.  ? escitalopram (LEXAPRO) 10 MG tablet Take 10 mg by mouth daily.  ? ibuprofen (ADVIL) 200 MG tablet Take 200 mg by mouth every 8 (eight) hours as needed.  ? Multiple Vitamin (MULTIVITAMIN) capsule Take 1 capsule by mouth daily.  ? ondansetron (ZOFRAN) 8 MG tablet Take 1 tablet (8 mg total) by mouth every 8 (eight) hours as needed for nausea or vomiting.  ? [DISCONTINUED] Chlorpheniramine Maleate (ALLERGY PO) Take by mouth.  ? [DISCONTINUED] ibuprofen (ADVIL,MOTRIN) 600 MG tablet Take 1 tablet (600 mg total) by mouth every 6 (six) hours.  ? [DISCONTINUED] methocarbamol (ROBAXIN) 500 MG tablet Take 500-1,000 mg by mouth at bedtime as needed.  ? [DISCONTINUED] oseltamivir (TAMIFLU) 75 MG capsule Take 1 capsule (75 mg total) by mouth every 12 (twelve) hours.  ? ?No facility-administered encounter medications on file as of 09/19/2021.  ?: ? ?Review of Systems:  ?Out of a complete 14 point review of systems, all are reviewed and  negative with the exception of these symptoms as listed below: ? ?Review of Systems  ?Neurological:   ?     Continued intermittent tremors in hand, foot and R leg/thigh.(Involuntary movements).  Feels ins

## 2021-09-20 LAB — COMPREHENSIVE METABOLIC PANEL
ALT: 18 IU/L (ref 0–32)
AST: 16 IU/L (ref 0–40)
Albumin/Globulin Ratio: 1.8 (ref 1.2–2.2)
Albumin: 4.9 g/dL — ABNORMAL HIGH (ref 3.8–4.8)
Alkaline Phosphatase: 112 IU/L (ref 44–121)
BUN/Creatinine Ratio: 16 (ref 9–23)
BUN: 14 mg/dL (ref 6–20)
Bilirubin Total: 0.3 mg/dL (ref 0.0–1.2)
CO2: 24 mmol/L (ref 20–29)
Calcium: 10.1 mg/dL (ref 8.7–10.2)
Chloride: 102 mmol/L (ref 96–106)
Creatinine, Ser: 0.9 mg/dL (ref 0.57–1.00)
Globulin, Total: 2.8 g/dL (ref 1.5–4.5)
Glucose: 96 mg/dL (ref 70–99)
Potassium: 4.7 mmol/L (ref 3.5–5.2)
Sodium: 141 mmol/L (ref 134–144)
Total Protein: 7.7 g/dL (ref 6.0–8.5)
eGFR: 84 mL/min/{1.73_m2} (ref >59)

## 2021-09-20 LAB — PHOSPHORUS: Phosphorus: 3.8 mg/dL (ref 3.0–4.3)

## 2021-09-20 LAB — ALDOLASE: Aldolase: 3.3 U/L (ref 3.3–10.3)

## 2021-09-20 LAB — MAGNESIUM: Magnesium: 2.2 mg/dL (ref 1.6–2.3)

## 2021-09-20 LAB — CK: Total CK: 120 U/L (ref 32–182)

## 2021-09-29 ENCOUNTER — Telehealth: Payer: Self-pay | Admitting: Neurology

## 2021-09-29 NOTE — Telephone Encounter (Signed)
LVM for pt to call back to schedule  ?[employer] Berkley Harvey: [id number] (exp. 09/26/21 to 11/10/21)  ?

## 2021-10-03 ENCOUNTER — Telehealth: Payer: Self-pay | Admitting: Neurology

## 2021-10-03 ENCOUNTER — Other Ambulatory Visit: Payer: 59 | Admitting: *Deleted

## 2021-10-03 NOTE — Telephone Encounter (Signed)
Pt scheduled for MRI brain w/wo contrast on 10/11/21 at 11:00 am. ?

## 2021-10-11 ENCOUNTER — Ambulatory Visit: Payer: 59

## 2021-10-11 DIAGNOSIS — R259 Unspecified abnormal involuntary movements: Secondary | ICD-10-CM

## 2021-10-11 DIAGNOSIS — R253 Fasciculation: Secondary | ICD-10-CM

## 2021-10-11 DIAGNOSIS — G514 Facial myokymia: Secondary | ICD-10-CM

## 2021-10-11 DIAGNOSIS — R251 Tremor, unspecified: Secondary | ICD-10-CM

## 2021-10-11 MED ORDER — GADOBENATE DIMEGLUMINE 529 MG/ML IV SOLN
17.0000 mL | Freq: Once | INTRAVENOUS | Status: AC | PRN
  Administered 2021-10-11: 17 mL via INTRAVENOUS

## 2021-10-17 ENCOUNTER — Ambulatory Visit (INDEPENDENT_AMBULATORY_CARE_PROVIDER_SITE_OTHER): Payer: 59 | Admitting: Neurology

## 2021-10-17 DIAGNOSIS — R251 Tremor, unspecified: Secondary | ICD-10-CM | POA: Diagnosis not present

## 2021-10-17 DIAGNOSIS — R253 Fasciculation: Secondary | ICD-10-CM

## 2021-10-17 DIAGNOSIS — G514 Facial myokymia: Secondary | ICD-10-CM

## 2021-10-17 DIAGNOSIS — R259 Unspecified abnormal involuntary movements: Secondary | ICD-10-CM

## 2021-10-18 NOTE — Procedures (Signed)
? ? ?  History: ? ?36 year old woman with intermittent tremors  ? ?EEG classification: Awake and drowsy ? ?Description of the recording: The background rhythms of this recording consists of a fairly well modulated medium amplitude alpha rhythm of 10 Hz that is reactive to eye opening and closure. As the record progresses, the patient appears to remain in the waking state throughout the recording. Photic stimulation was performed, did not show any abnormalities. Hyperventilation was also performed, did not show any abnormalities. Toward the end of the recording, the patient enters the drowsy state with slight symmetric slowing seen. The patient never enters stage II sleep. No abnormal epileptiform discharges seen during this recording. There was no focal slowing. EKG monitor shows no evidence of cardiac rhythm abnormalities with a heart rate of 60. ? ?Abnormality: None  ? ?Impression: This is a normal EEG recording in the waking and drowsy state. No evidence of interictal epileptiform discharges seen. A normal EEG does not exclude a diagnosis of epilepsy.  ? ? ?Windell Norfolk, MD ?[facility] ?  ?

## 2022-06-16 ENCOUNTER — Encounter: Payer: Self-pay | Admitting: Emergency Medicine

## 2022-06-16 ENCOUNTER — Ambulatory Visit
Admission: EM | Admit: 2022-06-16 | Discharge: 2022-06-16 | Disposition: A | Discharge status: Home / Self Care | Payer: Managed Care, Other (non HMO) | Attending: Family Medicine | Admitting: Family Medicine

## 2022-06-16 DIAGNOSIS — H6691 Otitis media, unspecified, right ear: Secondary | ICD-10-CM | POA: Diagnosis not present

## 2022-06-16 DIAGNOSIS — K122 Cellulitis and abscess of mouth: Secondary | ICD-10-CM

## 2022-06-16 MED ORDER — AMOXICILLIN-POT CLAVULANATE 875-125 MG PO TABS: 1.0000 | ORAL_TABLET | Freq: Two times a day (BID) | ORAL | 0 refills | Status: DC

## 2022-06-16 MED ORDER — ACETAMINOPHEN 325 MG PO TABS
650.0000 mg | ORAL_TABLET | Freq: Once | ORAL | Status: AC
  Administered 2022-06-16: 650 mg via ORAL

## 2022-06-16 NOTE — Discharge Instructions (Addendum)
Instructed patient to take medication as directed with food to completion. Encouraged patient to take increase daily water intake to 64 ounces per day while taking this medication advised if symptoms worsen and/or unresolved please follow-up with PCP or here for further evaluation.

## 2022-06-16 NOTE — ED Triage Notes (Signed)
Sore throat since Tuesday  R ear pain at 0200 OTC ibuprofen, Equate cold &  flu - no meds today  Denies fever

## 2022-06-16 NOTE — ED Provider Notes (Signed)
[facility]    CSN: [id number] Arrival date & time: 06/16/22  1093      History   Chief Complaint Chief Complaint  Patient presents with   Sore Throat    HPI Rachel [name] is a 36 y.o. female.   HPI Pleasant 36 year old female presents with sore throat, right ear pain for 3 to 4 days.  PMH significant for obesity, PCOS, and previous history of ruptured ovarian cyst.  Past Medical History:  Diagnosis Date   PCOS (polycystic ovarian syndrome)    Postpartum care following cesarean delivery (7/18) 01/18/2016   Ruptured ovarian cyst    Vaginal Pap smear, abnormal    Vaginal vestibulitis     Patient Active Problem List   Diagnosis Date Noted   Postoperative state 01/18/2016   Postpartum care following cesarean delivery (7/18) 01/18/2016   Pregnancy 01/17/2016    Past Surgical History:  Procedure Laterality Date   CESAREAN SECTION N/A 01/18/2016   Procedure: CESAREAN SECTION;  Surgeon: Genia Del, MD;  [facility];  Service: Obstetrics;  Laterality: N/A;   WISDOM TOOTH EXTRACTION      OB History     Gravida  2   Para  1   Term  1   Preterm      AB  1   Living  1      SAB  1   IAB      Ectopic      Multiple  0   Live Births  1            Home Medications    Prior to Admission medications   Medication Sig Start Date End Date Taking? Authorizing Provider  amoxicillin-clavulanate (AUGMENTIN) 875-125 MG tablet Take 1 tablet by mouth every 12 (twelve) hours. 06/16/22  Yes Trevor Iha, FNP  Cyanocobalamin (VITAMIN B12 PO) Take 1,000 mg by mouth daily.    [provider]  escitalopram (LEXAPRO) 10 MG tablet Take 10 mg by mouth daily. 04/08/21   [provider]  ibuprofen (ADVIL) 200 MG tablet Take 200 mg by mouth every 8 (eight) hours as needed. Patient not taking: Reported on 06/16/2022    [provider]  Multiple Vitamin (MULTIVITAMIN) capsule Take 1 capsule by mouth  daily. Patient not taking: Reported on 06/16/2022    [provider]  ondansetron (ZOFRAN) 8 MG tablet Take 1 tablet (8 mg total) by mouth every 8 (eight) hours as needed for nausea or vomiting. Patient not taking: Reported on 06/16/2022 05/10/21   Eustace Moore, MD    Family History Family History  Problem Relation Age of Onset   Hypertension Mother    Hypertension Father    Diabetes Paternal Grandmother     Social History Social History   Tobacco Use   Smoking status: Never   Smokeless tobacco: Never  Vaping Use   Vaping Use: Never used  Substance Use Topics   Alcohol use: Not Currently   Drug use: No     Allergies   Dextromethorphan-guaifenesin   Review of Systems Review of Systems  HENT:  Positive for congestion, ear pain and sore throat.   All other systems reviewed and are negative.    Physical Exam Triage Vital Signs ED Triage Vitals  Enc Vitals Group     BP 06/16/22 0830 120/86     Pulse Rate 06/16/22 0830 86     Resp 06/16/22 0830 14     Temp 06/16/22 0830 99.1 F (37.3 C)  Temp Source 06/16/22 0830 Oral     SpO2 06/16/22 0830 100 %     Weight 06/16/22 0833 200 lb (90.7 kg)     Height 06/16/22 0833 5\' 4"  (1.626 m)     Head Circumference --      Peak Flow --      Pain Score 06/16/22 0832 5     Pain Loc --      Pain Edu? --      Excl. in GC? --    No data found.  Updated Vital Signs BP 120/86 (BP Location: Left Arm)   Pulse 86   Temp 99.1 F (37.3 C) (Oral)   Resp 14   Ht 5\' 4"  (1.626 m)   Wt 200 lb (90.7 kg)   LMP 06/14/2022 (Exact Date)   SpO2 100%   Breastfeeding No   BMI 34.33 kg/m      Physical Exam Vitals and nursing note reviewed.  Constitutional:      General: She is not in acute distress.    Appearance: She is well-developed. She is obese. She is ill-appearing.  HENT:     Head: Normocephalic and atraumatic.     Right Ear: Ear canal and external ear normal.     Left Ear: Tympanic membrane, ear canal  and external ear normal.     Ears:     Comments: Right TM: Erythematous, bulging    Nose: Nose normal.     Mouth/Throat:     Mouth: Mucous membranes are moist.     Pharynx: Oropharynx is clear. Uvula midline. Uvula swelling present. No posterior oropharyngeal erythema.  Eyes:     Conjunctiva/sclera: Conjunctivae normal.     Pupils: Pupils are equal, round, and reactive to light.  Cardiovascular:     Rate and Rhythm: Normal rate and regular rhythm.     Heart sounds: Normal heart sounds. No murmur heard. Pulmonary:     Effort: Pulmonary effort is normal.     Breath sounds: Normal breath sounds. No wheezing, rhonchi or rales.  Musculoskeletal:        General: Normal range of motion.     Cervical back: Normal range of motion and neck supple. Tenderness present.  Lymphadenopathy:     Cervical: Cervical adenopathy present.  Skin:    General: Skin is warm and dry.  Neurological:     General: No focal deficit present.     Mental Status: She is alert and oriented to person, place, and time. Mental status is at baseline.      UC Treatments / Results  Labs (all labs ordered are listed, but only abnormal results are displayed) Labs Reviewed - No data to display  EKG   Radiology No results found.  Procedures Procedures (including critical care time)  Medications Ordered in UC Medications  acetaminophen (TYLENOL) tablet 650 mg (650 mg Oral Given 06/16/22 0840)    Initial Impression / Assessment and Plan / UC Course  I have reviewed the triage vital signs and the nursing notes.  Pertinent labs & imaging results that were available during my care of the patient were reviewed by me and considered in my medical decision making (see chart for details).    MDM: 1.  Acute right otitis media-Rx Augmentin; 2.  Uvulitis-Rx'd Augmentin. Instructed patient to take medication as directed with food to completion. Encouraged patient to take increase daily water intake to 64 ounces per day  while taking this medication advised if symptoms worsen and/or unresolved please follow-up with  PCP or here for further evaluation.  Patient discharged home, hemodynamically stable. Final Clinical Impressions(s) / UC Diagnoses   Final diagnoses:  Acute right otitis media  Uvulitis     Discharge Instructions      Instructed patient to take medication as directed with food to completion. Encouraged patient to take increase daily water intake to 64 ounces per day while taking this medication advised if symptoms worsen and/or unresolved please follow-up with PCP or here for further evaluation.     ED Prescriptions     Medication Sig Dispense Auth. Provider   amoxicillin-clavulanate (AUGMENTIN) 875-125 MG tablet Take 1 tablet by mouth every 12 (twelve) hours. 14 tablet Trevor Iha, FNP      PDMP not reviewed this encounter.   Trevor Iha, FNP 06/16/22 [phone]

## 2022-06-17 ENCOUNTER — Telehealth: Payer: Self-pay

## 2022-06-17 NOTE — Telephone Encounter (Signed)
TC to f/u after yesterday's visit to [facility]. No answer; left VM to call [phone] for problems or questions. 

## 2022-06-29 ENCOUNTER — Ambulatory Visit
Admission: EM | Admit: 2022-06-29 | Discharge: 2022-06-29 | Disposition: A | Discharge status: Home / Self Care | Payer: Managed Care, Other (non HMO) | Attending: Family Medicine | Admitting: Family Medicine

## 2022-06-29 DIAGNOSIS — J029 Acute pharyngitis, unspecified: Secondary | ICD-10-CM | POA: Diagnosis not present

## 2022-06-29 LAB — POCT RAPID STREP A (OFFICE): Rapid Strep A Screen: NEGATIVE

## 2022-06-29 NOTE — ED Provider Notes (Signed)
[facility]    CSN: [id number] Arrival date & time: 06/29/22  1700      History   Chief Complaint Chief Complaint  Patient presents with   Sore Throat    HPI Rachel [name] is a 36 y.o. female.   HPI Patient was seen for an ear infection.  Given 7 days of Augmentin.  She states her ear pain is much better.  At the time she was seen she had a severe sore throat.  She states the sore throat is almost as bad.  She is concerned she may have strep throat.  She was unable to get in here for follow-up so she went to a different urgent care center.  They gave her a  course of prednisone.  Did not help her symptoms either.  Is here for concerns regarding recurring sore throat/persistent  Past Medical History:  Diagnosis Date   PCOS (polycystic ovarian syndrome)    Postpartum care following cesarean delivery (7/18) 01/18/2016   Ruptured ovarian cyst    Vaginal Pap smear, abnormal    Vaginal vestibulitis     Patient Active Problem List   Diagnosis Date Noted   Postoperative state 01/18/2016   Postpartum care following cesarean delivery (7/18) 01/18/2016   Pregnancy 01/17/2016    Past Surgical History:  Procedure Laterality Date   CESAREAN SECTION N/A 01/18/2016   Procedure: CESAREAN SECTION;  Surgeon: Genia Del, MD;  Location: [facility];  Service: Obstetrics;  Laterality: N/A;   WISDOM TOOTH EXTRACTION      OB History     Gravida  2   Para  1   Term  1   Preterm      AB  1   Living  1      SAB  1   IAB      Ectopic      Multiple  0   Live Births  1            Home Medications    Prior to Admission medications   Medication Sig Start Date End Date Taking? Authorizing Provider  Cyanocobalamin (VITAMIN B12 PO) Take 1,000 mg by mouth daily.    [provider]  escitalopram (LEXAPRO) 10 MG tablet Take 10 mg by mouth daily. 04/08/21   [provider]    Family History Family History  Problem Relation  Age of Onset   Hypertension Mother    Hypertension Father    Diabetes Paternal Grandmother     Social History Social History   Tobacco Use   Smoking status: Never   Smokeless tobacco: Never  Vaping Use   Vaping Use: Never used  Substance Use Topics   Alcohol use: Not Currently   Drug use: No     Allergies   Dextromethorphan-guaifenesin   Review of Systems Review of Systems See HPI  Physical Exam Triage Vital Signs ED Triage Vitals [06/29/22 1740]  Enc Vitals Group     BP 115/81     Pulse Rate 96     Resp 14     Temp 98.9 F (37.2 C)     Temp Source Oral     SpO2 96 %     Weight      Height      Head Circumference      Peak Flow      Pain Score 8     Pain Loc      Pain Edu?      Excl. in  GC?    No data found.  Updated Vital Signs BP 115/81 (BP Location: Right Arm)   Pulse 96   Temp 98.9 F (37.2 C) (Oral)   Resp 14   LMP 06/14/2022 (Exact Date)   SpO2 96%      Physical Exam Constitutional:      General: She is not in acute distress.    Appearance: She is well-developed. She is ill-appearing.  HENT:     Head: Normocephalic and atraumatic.     Right Ear: Tympanic membrane and ear canal normal.     Left Ear: Tympanic membrane and ear canal normal.     Nose: No congestion.     Mouth/Throat:     Mouth: Mucous membranes are moist.     Pharynx: Uvula midline. Posterior oropharyngeal erythema present. No pharyngeal swelling or uvula swelling.     Tonsils: No tonsillar exudate or tonsillar abscesses. 0 on the right. 0 on the left.     Comments: There is cobblestoning, lymphoid hyperplasia of the posterior pharynx consistent with PND.  No tonsillar swelling or inflammation.  No exudate.  Patient desires strep test.  It is negative Eyes:     Conjunctiva/sclera: Conjunctivae normal.     Pupils: Pupils are equal, round, and reactive to light.  Cardiovascular:     Rate and Rhythm: Normal rate.  Pulmonary:     Effort: Pulmonary effort is normal. No  respiratory distress.  Abdominal:     General: There is no distension.     Palpations: Abdomen is soft.  Musculoskeletal:        General: Normal range of motion.     Cervical back: Normal range of motion.  Lymphadenopathy:     Cervical: Cervical adenopathy present.  Skin:    General: Skin is warm and dry.  Neurological:     Mental Status: She is alert.      UC Treatments / Results  Labs (all labs ordered are listed, but only abnormal results are displayed) Labs Reviewed  POCT RAPID STREP A (OFFICE)    EKG   Radiology No results found.  Procedures Procedures (including critical care time)  Medications Ordered in UC Medications - No data to display  Initial Impression / Assessment and Plan / UC Course  I have reviewed the triage vital signs and the nursing notes.  Pertinent labs & imaging results that were available during my care of the patient were reviewed by me and considered in my medical decision making (see chart for details).     I explained to the patient that her likely initial infection was a virus.  From the virus she developed otitis media and this was treated with antibiotics.  The virus is left her with a postinflammatory sore throat and some postnasal drip.  I do not see any evidence of recurrent or persistent bacterial infection that would require antibiotics.  Symptomatic cares reviewed. Final Clinical Impressions(s) / UC Diagnoses   Final diagnoses:  Viral pharyngitis     Discharge Instructions      Strep test is negative May alternate Tylenol 1000 mg with ibuprofen 800 mg every few hours for your throat pain. May use Chloraseptic sprays and lozenges Continue allergy medicine to slow the postnasal drip Over-the-counter cough medication See your doctor if not better by next week    ED Prescriptions   None    PDMP not reviewed this encounter.   Eustace Moore, MD 06/29/22 [phone]

## 2022-06-29 NOTE — Discharge Instructions (Addendum)
Strep test is negative May alternate Tylenol 1000 mg with ibuprofen 800 mg every few hours for your throat pain. May use Chloraseptic sprays and lozenges Continue allergy medicine to slow the postnasal drip Over-the-counter cough medication See your doctor if not better by next week

## 2022-06-29 NOTE — ED Triage Notes (Signed)
Pt presents with reoccurring sore throat after being dx and treated for uvulitis.

## 2022-07-02 LAB — CULTURE, GROUP A STREP (THRC)

## 2023-02-12 ENCOUNTER — Encounter (HOSPITAL_COMMUNITY): Payer: Self-pay

## 2023-02-12 NOTE — Pre-Procedure Instructions (Signed)
Surgical Instructions    Your procedure is scheduled on February 19, 2023.  Report to [facility] Main Entrance "A" at 7:00 A.M., then check in with the Admitting office.  Call this number if you have problems the morning of surgery:  [phone]   If you have any questions prior to your surgery date call [phone]: Open Monday-Friday 8am-4pm If you experience any cold or flu symptoms such as cough, fever, chills, shortness of breath, etc. between now and your scheduled surgery, please notify us at the above number     Remember:  Do not eat after midnight the night before your surgery  You may drink clear liquids until 6:00am the morning of your surgery.   Clear liquids allowed are: Water, Non-Citrus Juices (without pulp), Carbonated Beverages, Clear Tea, Black Coffee ONLY (NO MILK, CREAM OR POWDERED CREAMER of any kind), and Gatorade    Take these medicines the morning of surgery with A SIP OF WATER:   If needed: Acetaminophen (Tylenol)  As of today, STOP taking any Aspirin (unless otherwise instructed by your surgeon) Aleve, Naproxen, Ibuprofen, Motrin, Advil, Goody's, BC's, all herbal medications, fish oil, and all vitamins.           Do not wear jewelry or makeup. Do not wear lotions, powders, perfumes or deodorant. Do not shave 48 hours prior to surgery.   Do not bring valuables to the hospital. Do not wear nail polish, gel polish, artificial nails, or any other type of covering on natural nails (fingers and toes) If you have artificial nails or gel coating that need to be removed by a nail salon, please have this removed prior to surgery. Artificial nails or gel coating may interfere with anesthesia's ability to adequately monitor your vital signs.  [facility] is not responsible for any belongings or valuables.    Do NOT Smoke (Tobacco/Vaping)  24 hours prior to your procedure  If you use a CPAP at night, you may bring your mask for your overnight stay.   Contacts,  glasses, hearing aids, dentures or partials may not be worn into surgery, please bring cases for these belongings   For patients admitted to the hospital, discharge time will be determined by your treatment team.   Patients discharged the day of surgery will not be allowed to drive home, and someone needs to stay with them for 24 hours.   SURGICAL WAITING ROOM VISITATION Patients having surgery or a procedure may have no more than 2 support people in the waiting area - these visitors may rotate.   Children under the age of 79 must have an adult with them who is not the patient. If the patient needs to stay at the hospital during part of their recovery, the visitor guidelines for inpatient rooms apply. Pre-op nurse will coordinate an appropriate time for 1 support person to accompany patient in pre-op.  This support person may not rotate.   Please refer to [email or web address] for the visitor guidelines for Inpatients (after your surgery is over and you are in a regular room).    Special instructions:    Oral Hygiene is also important to reduce your risk of infection.  Remember - BRUSH YOUR TEETH THE MORNING OF SURGERY WITH YOUR REGULAR TOOTHPASTE   [facility]- Preparing For Surgery  Before surgery, you can play an important role. Because skin is not sterile, your skin needs to be as free of germs as possible. You can reduce the number of germs on your skin by  washing with CHG (chlorahexidine gluconate) Soap before surgery.  CHG is an antiseptic cleaner which kills germs and bonds with the skin to continue killing germs even after washing.     Please do not use if you have an allergy to CHG or antibacterial soaps. If your skin becomes reddened/irritated stop using the CHG.  Do not shave (including legs and underarms) for at least 48 hours prior to first CHG shower. It is OK to shave your face.  Please follow these instructions  carefully.     Shower the NIGHT BEFORE SURGERY and the MORNING OF SURGERY with CHG Soap.   If you chose to wash your hair, wash your hair first as usual with your normal shampoo. After you shampoo, rinse your hair and body thoroughly to remove the shampoo.  Then [employer] and genitals (private parts) with your normal soap and rinse thoroughly to remove soap.  After that Use CHG Soap as you would any other liquid soap. You can apply CHG directly to the skin and wash gently with a scrungie or a clean washcloth.   Apply the CHG Soap to your body ONLY FROM THE NECK DOWN.  Do not use on open wounds or open sores. Avoid contact with your eyes, ears, mouth and genitals (private parts). Wash Face and genitals (private parts)  with your normal soap.   Wash thoroughly, paying special attention to the area where your surgery will be performed.  Thoroughly rinse your body with warm water from the neck down.  DO NOT shower/wash with your normal soap after using and rinsing off the CHG Soap.  Pat yourself dry with a CLEAN TOWEL.  Wear CLEAN PAJAMAS to bed the night before surgery  Place CLEAN SHEETS on your bed the night before your surgery  DO NOT SLEEP WITH PETS.   Day of Surgery:  Take a shower with CHG soap. Wear Clean/Comfortable clothing the morning of surgery Do not apply any deodorants/lotions.   Remember to brush your teeth WITH YOUR REGULAR TOOTHPASTE.    If you received a COVID test during your pre-op visit, it is requested that you wear a mask when out in public, stay away from anyone that may not be feeling well, and notify your surgeon if you develop symptoms. If you have been in contact with anyone that has tested positive in the last 10 days, please notify your surgeon.    Please read over the following fact sheets that you were given.

## 2023-02-13 ENCOUNTER — Other Ambulatory Visit: Payer: Self-pay

## 2023-02-13 ENCOUNTER — Encounter (HOSPITAL_COMMUNITY): Payer: Self-pay

## 2023-02-13 ENCOUNTER — Encounter (HOSPITAL_COMMUNITY)
Admission: RE | Admit: 2023-02-13 | Discharge: 2023-02-13 | Disposition: A | Discharge status: Home / Self Care | Payer: Managed Care, Other (non HMO) | Source: Ambulatory Visit | Attending: Anesthesiology | Admitting: Anesthesiology

## 2023-02-13 VITALS — BP 118/83 | HR 71 | Temp 98.2°F | Resp 17 | Ht 64.0 in | Wt 214.6 lb

## 2023-02-13 DIAGNOSIS — Z01818 Encounter for other preprocedural examination: Secondary | ICD-10-CM

## 2023-02-13 DIAGNOSIS — Z01812 Encounter for preprocedural laboratory examination: Secondary | ICD-10-CM | POA: Insufficient documentation

## 2023-02-13 HISTORY — DX: Depression, unspecified: F32.A

## 2023-02-13 HISTORY — DX: Anemia, unspecified: D64.9

## 2023-02-13 HISTORY — DX: Anxiety disorder, unspecified: F41.9

## 2023-02-13 LAB — CBC
HCT: 39.4 % (ref 36.0–46.0)
Hemoglobin: 12.8 g/dL (ref 12.0–15.0)
MCH: 29.6 pg (ref 26.0–34.0)
MCHC: 32.5 g/dL (ref 30.0–36.0)
MCV: 91.2 fL (ref 80.0–100.0)
Platelets: 248 10*3/uL (ref 150–400)
RBC: 4.32 MIL/uL (ref 3.87–5.11)
RDW: 12.9 % (ref 11.5–15.5)
WBC: 6.5 10*3/uL (ref 4.0–10.5)
nRBC: 0 % (ref 0.0–0.2)

## 2023-02-13 NOTE — Progress Notes (Signed)
PCP - Dr. Farris Has Cardiologist - Denies  PPM/ICD - Denies Device Orders - n/a Rep Notified - n/a  Chest x-ray - Denies EKG - Denies Stress Test - Denies ECHO - Denies Cardiac Cath - Denies  Sleep Study - Pt has had a sleep study in 2022, but was negative CPAP - n/a  No DM  Last dose of GLP1 agonist- n/a GLP1 instructions: n/a  Blood Thinner Instructions: n/a Aspirin Instructions: n/a  ERAS Protcol - Clear liquids until 0600 morning of surgery PRE-SURGERY Ensure or G2- n/a  COVID TEST- n/a   Anesthesia review: No.   Patient denies shortness of breath, fever, cough and chest pain at PAT appointment. Pt denies any respiratory illness/infection in the last two months.   All instructions explained to the patient, with a verbal understanding of the material. Patient agrees to go over the instructions while at home for a better understanding. Patient also instructed to self quarantine after being tested for COVID-19. The opportunity to ask questions was provided.

## 2023-02-13 NOTE — Pre-Procedure Instructions (Signed)
Surgical Instructions   Your procedure is scheduled on February 19, 2023. Report to [facility] Main Entrance "A" at 7:00 A.M., then check in with the Admitting office. Any questions or running late day of surgery: call [phone]  Questions prior to your surgery date: call [phone], Monday-Friday, 8am-4pm. If you experience any cold or flu symptoms such as cough, fever, chills, shortness of breath, etc. between now and your scheduled surgery, please notify us at the above number.     Remember:  Do not eat after midnight the night before your surgery   You may drink clear liquids until 6:00 AM the morning of your surgery.   Clear liquids allowed are: Water, Non-Citrus Juices (without pulp), Carbonated Beverages, Clear Tea, Black Coffee Only (NO MILK, CREAM OR POWDERED CREAMER of any kind), and Gatorade.    Take these medicines the morning of surgery with A SIP OF WATER: acetaminophen (TYLENOL) - may take if needed    One week prior to surgery, STOP taking any Aspirin (unless otherwise instructed by your surgeon) Aleve, Naproxen, Ibuprofen, Motrin, Advil, Goody's, BC's, all herbal medications, fish oil, and non-prescription vitamins.                     Do NOT Smoke (Tobacco/Vaping) for 24 hours prior to your procedure.  If you use a CPAP at night, you may bring your mask/headgear for your overnight stay.   You will be asked to remove any contacts, glasses, piercing's, hearing aid's, dentures/partials prior to surgery. Please bring cases for these items if needed.    Patients discharged the day of surgery will not be allowed to drive home, and someone needs to stay with them for 24 hours.  SURGICAL WAITING ROOM VISITATION Patients may have no more than 2 support people in the waiting area - these visitors may rotate.   Pre-op nurse will coordinate an appropriate time for 1 ADULT support person, who may not rotate, to accompany patient in pre-op.  Children under the age of 45  must have an adult with them who is not the patient and must remain in the main waiting area with an adult.  If the patient needs to stay at the hospital during part of their recovery, the visitor guidelines for inpatient rooms apply.  Please refer to the [facility] website for the visitor guidelines for any additional information.   If you received a COVID test during your pre-op visit  it is requested that you wear a mask when out in public, stay away from anyone that may not be feeling well and notify your surgeon if you develop symptoms. If you have been in contact with anyone that has tested positive in the last 10 days please notify you surgeon.      Pre-operative CHG Bathing Instructions   You can play a key role in reducing the risk of infection after surgery. Your skin needs to be as free of germs as possible. You can reduce the number of germs on your skin by washing with CHG (chlorhexidine gluconate) soap before surgery. CHG is an antiseptic soap that kills germs and continues to kill germs even after washing.   DO NOT use if you have an allergy to chlorhexidine/CHG or antibacterial soaps. If your skin becomes reddened or irritated, stop using the CHG and notify one of our RNs at [phone].              TAKE A SHOWER THE NIGHT BEFORE SURGERY AND THE  DAY OF SURGERY    Please keep in mind the following:  DO NOT shave, including legs and underarms, 48 hours prior to surgery.   You may shave your face before/day of surgery.  Place clean sheets on your bed the night before surgery Use a clean washcloth (not used since being washed) for each shower. DO NOT sleep with pet's night before surgery.  CHG Shower Instructions:  If you choose to wash your hair and private area, wash first with your normal shampoo/soap.  After you use shampoo/soap, rinse your hair and body thoroughly to remove shampoo/soap residue.  Turn the water OFF and apply half the bottle of CHG soap to a CLEAN  washcloth.  Apply CHG soap ONLY FROM YOUR NECK DOWN TO YOUR TOES (washing for 3-5 minutes)  DO NOT use CHG soap on face, private areas, open wounds, or sores.  Pay special attention to the area where your surgery is being performed.  If you are having back surgery, having someone wash your back for you may be helpful. Wait 2 minutes after CHG soap is applied, then you may rinse off the CHG soap.  Pat dry with a clean towel  Put on clean pajamas    Additional instructions for the day of surgery: DO NOT APPLY any lotions, deodorants, cologne, or perfumes.   Do not wear jewelry or makeup Do not wear nail polish, gel polish, artificial nails, or any other type of covering on natural nails (fingers and toes) Do not bring valuables to the hospital. [facility] is not responsible for valuables/personal belongings. Put on clean/comfortable clothes.  Please brush your teeth.  Ask your nurse before applying any prescription medications to the skin.

## 2023-02-16 ENCOUNTER — Ambulatory Visit: Payer: Self-pay | Admitting: Surgery

## 2023-02-16 NOTE — Progress Notes (Signed)
Surgery orders requested via Epic inbox. °

## 2023-02-16 NOTE — Progress Notes (Signed)
Patient phoned to give updated information on surgery that was moved from [facility] to [facility].    Date of Surgery - 02-19-23  Arrival Time - 6:45 and check in at admitting.    NPO Status - patient reminded to not eat solid food after midnight.  From midnight until 6:00 AM may have clear liquids  Medications morning of surgery - Tylenol if needed  No change in medical history, allergies per patient.  Transportation home -Jariah [place]  All questions answered and patient stated understanding

## 2023-02-19 ENCOUNTER — Ambulatory Visit (HOSPITAL_COMMUNITY): Payer: Managed Care, Other (non HMO) | Admitting: Anesthesiology

## 2023-02-19 ENCOUNTER — Ambulatory Visit (HOSPITAL_COMMUNITY)
Admission: RE | Admit: 2023-02-19 | Discharge: 2023-02-19 | Disposition: A | Discharge status: Home / Self Care | Payer: Managed Care, Other (non HMO) | Attending: Surgery | Admitting: Surgery

## 2023-02-19 ENCOUNTER — Encounter (HOSPITAL_COMMUNITY)
Admission: RE | Disposition: A | Discharge status: Home / Self Care | Payer: Self-pay | Source: Home / Self Care | Attending: Surgery

## 2023-02-19 ENCOUNTER — Encounter (HOSPITAL_COMMUNITY): Payer: Self-pay | Admitting: Surgery

## 2023-02-19 ENCOUNTER — Ambulatory Visit (HOSPITAL_BASED_OUTPATIENT_CLINIC_OR_DEPARTMENT_OTHER): Payer: Managed Care, Other (non HMO) | Admitting: Anesthesiology

## 2023-02-19 ENCOUNTER — Other Ambulatory Visit: Payer: Self-pay

## 2023-02-19 DIAGNOSIS — Z01818 Encounter for other preprocedural examination: Secondary | ICD-10-CM

## 2023-02-19 DIAGNOSIS — K429 Umbilical hernia without obstruction or gangrene: Secondary | ICD-10-CM | POA: Insufficient documentation

## 2023-02-19 HISTORY — PX: UMBILICAL HERNIA REPAIR: SHX196

## 2023-02-19 LAB — POCT PREGNANCY, URINE: Preg Test, Ur: NEGATIVE

## 2023-02-19 SURGERY — REPAIR, HERNIA, UMBILICAL, ADULT
Anesthesia: General | Site: Abdomen

## 2023-02-19 MED ORDER — BUPIVACAINE LIPOSOME 1.3 % IJ SUSP: 20.0000 mL | Freq: Once | INTRAMUSCULAR | Status: DC

## 2023-02-19 MED ORDER — DOCUSATE SODIUM 100 MG PO CAPS: 100.0000 mg | ORAL_CAPSULE | Freq: Two times a day (BID) | ORAL | 2 refills | Status: AC

## 2023-02-19 MED ORDER — ROCURONIUM BROMIDE 100 MG/10ML IV SOLN
INTRAVENOUS | Status: DC | PRN
  Administered 2023-02-19: 50 mg via INTRAVENOUS

## 2023-02-19 MED ORDER — BUPIVACAINE LIPOSOME 1.3 % IJ SUSP
INTRAMUSCULAR | Status: DC | PRN
  Administered 2023-02-19: 20 mL

## 2023-02-19 MED ORDER — BUPIVACAINE HCL 0.25 % IJ SOLN
INTRAMUSCULAR | Status: AC
  Filled 2023-02-19: qty 1

## 2023-02-19 MED ORDER — ACETAMINOPHEN 500 MG PO TABS: 1000.0000 mg | ORAL_TABLET | Freq: Four times a day (QID) | ORAL | 3 refills | Status: AC

## 2023-02-19 MED ORDER — CHLORHEXIDINE GLUCONATE CLOTH 2 % EX PADS: 6.0000 | MEDICATED_PAD | Freq: Once | CUTANEOUS | Status: DC

## 2023-02-19 MED ORDER — ONDANSETRON HCL 4 MG/2ML IJ SOLN
INTRAMUSCULAR | Status: DC | PRN
  Administered 2023-02-19: 4 mg via INTRAVENOUS

## 2023-02-19 MED ORDER — OXYCODONE HCL 5 MG/5ML PO SOLN: 5.0000 mg | Freq: Once | ORAL | Status: DC | PRN

## 2023-02-19 MED ORDER — PROPOFOL 10 MG/ML IV BOLUS
INTRAVENOUS | Status: DC | PRN
  Administered 2023-02-19: 200 mg via INTRAVENOUS

## 2023-02-19 MED ORDER — OXYCODONE HCL 5 MG PO TABS: 5.0000 mg | ORAL_TABLET | Freq: Once | ORAL | Status: DC | PRN

## 2023-02-19 MED ORDER — FENTANYL CITRATE (PF) 100 MCG/2ML IJ SOLN
INTRAMUSCULAR | Status: DC | PRN
  Administered 2023-02-19 (×2): 50 ug via INTRAVENOUS

## 2023-02-19 MED ORDER — OXYCODONE HCL 5 MG PO TABS: 5.0000 mg | ORAL_TABLET | ORAL | 0 refills | Status: AC | PRN

## 2023-02-19 MED ORDER — 0.9 % SODIUM CHLORIDE (POUR BTL) OPTIME
TOPICAL | Status: DC | PRN
  Administered 2023-02-19: 1000 mL

## 2023-02-19 MED ORDER — MIDAZOLAM HCL 5 MG/5ML IJ SOLN
INTRAMUSCULAR | Status: DC | PRN
  Administered 2023-02-19: 2 mg via INTRAVENOUS

## 2023-02-19 MED ORDER — ENOXAPARIN SODIUM 40 MG/0.4ML IJ SOSY
40.0000 mg | PREFILLED_SYRINGE | Freq: Once | INTRAMUSCULAR | Status: AC
  Administered 2023-02-19: 40 mg via SUBCUTANEOUS
  Filled 2023-02-19: qty 0.4

## 2023-02-19 MED ORDER — LACTATED RINGERS IV SOLN: INTRAVENOUS | Status: DC

## 2023-02-19 MED ORDER — METHOCARBAMOL 750 MG PO TABS: 750.0000 mg | ORAL_TABLET | Freq: Four times a day (QID) | ORAL | 1 refills | Status: AC

## 2023-02-19 MED ORDER — ONDANSETRON HCL 4 MG/2ML IJ SOLN: 4.0000 mg | Freq: Four times a day (QID) | INTRAMUSCULAR | Status: DC | PRN

## 2023-02-19 MED ORDER — ROCURONIUM BROMIDE 10 MG/ML (PF) SYRINGE
PREFILLED_SYRINGE | INTRAVENOUS | Status: AC
  Filled 2023-02-19: qty 10

## 2023-02-19 MED ORDER — IBUPROFEN 600 MG PO TABS: 600.0000 mg | ORAL_TABLET | Freq: Four times a day (QID) | ORAL | 1 refills | Status: AC

## 2023-02-19 MED ORDER — MIDAZOLAM HCL 2 MG/2ML IJ SOLN
INTRAMUSCULAR | Status: AC
  Filled 2023-02-19: qty 2

## 2023-02-19 MED ORDER — PROPOFOL 10 MG/ML IV BOLUS
INTRAVENOUS | Status: AC
  Filled 2023-02-19: qty 20

## 2023-02-19 MED ORDER — FENTANYL CITRATE PF 50 MCG/ML IJ SOSY: 25.0000 ug | PREFILLED_SYRINGE | INTRAMUSCULAR | Status: DC | PRN

## 2023-02-19 MED ORDER — ACETAMINOPHEN 500 MG PO TABS
1000.0000 mg | ORAL_TABLET | ORAL | Status: DC
  Filled 2023-02-19: qty 2

## 2023-02-19 MED ORDER — DEXAMETHASONE SODIUM PHOSPHATE 10 MG/ML IJ SOLN
INTRAMUSCULAR | Status: AC
  Filled 2023-02-19: qty 1

## 2023-02-19 MED ORDER — ORAL CARE MOUTH RINSE: 15.0000 mL | Freq: Once | OROMUCOSAL | Status: AC

## 2023-02-19 MED ORDER — BUPIVACAINE HCL (PF) 0.25 % IJ SOLN
INTRAMUSCULAR | Status: DC | PRN
  Administered 2023-02-19: 20 mL

## 2023-02-19 MED ORDER — SCOPOLAMINE 1 MG/3DAYS TD PT72
MEDICATED_PATCH | TRANSDERMAL | Status: DC | PRN
  Administered 2023-02-19: 1 via TRANSDERMAL

## 2023-02-19 MED ORDER — FENTANYL CITRATE (PF) 100 MCG/2ML IJ SOLN
INTRAMUSCULAR | Status: AC
  Filled 2023-02-19: qty 2

## 2023-02-19 MED ORDER — CEFAZOLIN SODIUM-DEXTROSE 2-4 GM/100ML-% IV SOLN
2.0000 g | INTRAVENOUS | Status: AC
  Administered 2023-02-19: 2 mg via INTRAVENOUS
  Filled 2023-02-19: qty 100

## 2023-02-19 MED ORDER — FENTANYL CITRATE PF 50 MCG/ML IJ SOSY
PREFILLED_SYRINGE | INTRAMUSCULAR | Status: AC
  Administered 2023-02-19: 25 ug via INTRAVENOUS
  Filled 2023-02-19: qty 2

## 2023-02-19 MED ORDER — DEXAMETHASONE SODIUM PHOSPHATE 10 MG/ML IJ SOLN
INTRAMUSCULAR | Status: DC | PRN
  Administered 2023-02-19: 10 mg via INTRAVENOUS

## 2023-02-19 MED ORDER — SCOPOLAMINE 1 MG/3DAYS TD PT72
MEDICATED_PATCH | TRANSDERMAL | Status: AC
  Filled 2023-02-19: qty 1

## 2023-02-19 MED ORDER — ONDANSETRON HCL 4 MG/2ML IJ SOLN
INTRAMUSCULAR | Status: AC
  Filled 2023-02-19: qty 2

## 2023-02-19 MED ORDER — BUPIVACAINE LIPOSOME 1.3 % IJ SUSP
INTRAMUSCULAR | Status: AC
  Filled 2023-02-19: qty 20

## 2023-02-19 MED ORDER — CHLORHEXIDINE GLUCONATE 0.12 % MT SOLN
15.0000 mL | Freq: Once | OROMUCOSAL | Status: AC
  Administered 2023-02-19: 15 mL via OROMUCOSAL

## 2023-02-19 MED ORDER — SUGAMMADEX SODIUM 200 MG/2ML IV SOLN
INTRAVENOUS | Status: DC | PRN
Start: 2023-02-19 — End: 2023-02-19
  Administered 2023-02-19: 200 mg via INTRAVENOUS

## 2023-02-19 MED ORDER — LIDOCAINE HCL (CARDIAC) PF 100 MG/5ML IV SOSY
PREFILLED_SYRINGE | INTRAVENOUS | Status: DC | PRN
  Administered 2023-02-19: 60 mg via INTRAVENOUS

## 2023-02-19 SURGICAL SUPPLY — 28 items
ADH SKN CLS APL DERMABOND .7 (GAUZE/BANDAGES/DRESSINGS) ×1
APL PRP STRL LF DISP 70% ISPRP (MISCELLANEOUS) ×1
BINDER ABDOMINAL 12 ML 46-62 (SOFTGOODS) IMPLANT
BLADE CLIPPER SURG (BLADE) IMPLANT
CHLORAPREP W/TINT 26 (MISCELLANEOUS) ×1 IMPLANT
COVER SURGICAL LIGHT HANDLE (MISCELLANEOUS) ×1 IMPLANT
DERMABOND ADVANCED .7 DNX12 (GAUZE/BANDAGES/DRESSINGS) ×1 IMPLANT
DRAPE LAPAROSCOPIC ABDOMINAL (DRAPES) ×1 IMPLANT
ELECT REM PT RETURN 15FT ADLT (MISCELLANEOUS) ×1 IMPLANT
GAUZE 4X4 16PLY ~~LOC~~+RFID DBL (SPONGE) IMPLANT
GLOVE BIO SURGEON STRL SZ 6.5 (GLOVE) ×1 IMPLANT
GLOVE BIOGEL PI IND STRL 6 (GLOVE) ×1 IMPLANT
GOWN STRL REUS W/ TWL LRG LVL3 (GOWN DISPOSABLE) ×1 IMPLANT
GOWN STRL REUS W/TWL LRG LVL3 (GOWN DISPOSABLE) ×1
KIT BASIN OR (CUSTOM PROCEDURE TRAY) ×1 IMPLANT
KIT TURNOVER KIT A (KITS) ×1 IMPLANT
NDL HYPO 22X1.5 SAFETY MO (MISCELLANEOUS) ×1 IMPLANT
NEEDLE HYPO 22X1.5 SAFETY MO (MISCELLANEOUS) ×1
NS IRRIG 1000ML POUR BTL (IV SOLUTION) ×1 IMPLANT
PACK GENERAL/GYN (CUSTOM PROCEDURE TRAY) ×1 IMPLANT
SUT MNCRL AB 4-0 PS2 18 (SUTURE) ×1 IMPLANT
SUT NOVA NAB DX-16 0-1 5-0 T12 (SUTURE) IMPLANT
SUT NOVA NAB GS-21 0 18 T12 DT (SUTURE) IMPLANT
SUT NOVA NAB GS-21 1 T12 (SUTURE) IMPLANT
SUT VIC AB 3-0 SH 27 (SUTURE) ×1
SUT VIC AB 3-0 SH 27X BRD (SUTURE) IMPLANT
SYR CONTROL 10ML LL (SYRINGE) ×1 IMPLANT
TOWEL GREEN STERILE FF (TOWEL DISPOSABLE) ×1 IMPLANT

## 2023-02-19 NOTE — Transfer of Care (Signed)
Immediate Anesthesia Transfer of Care Note  Patient: Rachel [name]  Procedure(s) Performed: OPEN PRIMARY UMBILICAL HERNIA REPAIR (Abdomen)  Patient Location: PACU  Anesthesia Type:General  Level of Consciousness: drowsy and patient cooperative  Airway & Oxygen Therapy: Patient Spontanous Breathing and Patient connected to face mask oxygen  Post-op Assessment: Report given to RN and Post -op Vital signs reviewed and stable  Post vital signs: Reviewed and stable  Last Vitals:  Vitals Value Taken Time  BP 146/100 02/19/23 1008  Temp    Pulse 84 02/19/23 1011  Resp 14 02/19/23 1011  SpO2 93 % 02/19/23 1011  Vitals shown include unfiled device data.  Last Pain:  Vitals:   02/19/23 0703  TempSrc: Oral         Complications: No notable events documented.

## 2023-02-19 NOTE — Discharge Instructions (Addendum)
[facility]  LAPAROSCOPIC SURGERY: POST OP INSTRUCTIONS Always review your discharge instruction sheet given to you by the facility where your surgery was performed. IF YOU HAVE DISABILITY OR FAMILY LEAVE FORMS, YOU MUST BRING THEM TO THE OFFICE FOR PROCESSING.   DO NOT GIVE THEM TO YOUR DOCTOR.  PAIN CONTROL  Pain regimen: take over-the-counter tylenol (acetaminophen) 1000mg  every six hours, the prescription ibuprofen (600mg ) every six hours and the robaxin (methocarbamol) 750mg  every six hours. With all three of these, you should be taking something every two hours. Example: tylenol ( acetaminophen) at 8am, ibuprofen at 10am, robaxin (methocarbamol) at 12pm, tylenol (acetaminophen) again at 2pm, ibuprofen again at 4pm, robaxin (methocarbamol) at 6pm. You also have a prescription for oxycodone, which should be taken if the tylenol (acetaminophen), ibuprofen, and robaxin (methocarbamol) are not enough to control your pain. You may take the oxycodone as frequently as every four hours as needed, but if you are taking the other medications as above, you should not need the oxycodone this frequently. You have also been given a prescription for colace (docusate) which is a stool softener. Please take this as prescribed because the oxycodone can cause constipation and the colace (docusate) will minimize or prevent constipation. Do not drive while taking or under the influence of the oxycodone as it is a narcotic medication. Use ice packs to help control pain. If you need a refill on your pain medication, please contact your pharmacy.  They will contact our office to request authorization. Prescriptions will not be filled after 5pm or on week-ends.  HOME MEDICATIONS Take your usually prescribed medications unless otherwise directed.  DIET You should follow a light diet the first few days after arrival home.  Be sure to include lots of fluids daily.   CONSTIPATION It is common to  experience some constipation after surgery and if you are taking pain medication.  Increasing fluid intake and taking a stool softener (such as Colace) will usually help or prevent this problem from occurring.  A mild laxative (Miralax, over-the counter) should be taken according to package instructions if there are no bowel movements after 48 hours. If still no bowel movement 24 hours after taking Miralax, you may try magnesium citrate, available over the counter at a local pharmacy.   WOUND/INCISION CARE Most patients will experience some swelling and bruising in the area of the incisions.  Ice packs will help.  Swelling and bruising can take several days to resolve.  May shower beginning 02/20/2023.  Do not peel off or scrub skin glue. May allow warm soapy water to run over incision, then rinse and pat dry.  Do not soak in any water (tubs, hot tubs, pools, lakes, oceans) for one week.   ACTIVITIES You may resume regular (light) daily activities beginning the next day--such as daily self-care, walking, climbing stairs--gradually increasing activities as tolerated.  You may have sexual intercourse when it is comfortable.   No lifting greater than 5 pounds for six weeks.  You may drive when you are no longer taking narcotic pain medication, you can comfortably wear a seatbelt, and you can safely maneuver your car and apply brakes.  FOLLOW-UP You should see your doctor in the office for a follow-up appointment approximately 2-3 weeks after your surgery.  You should have been given your post-op/follow-up appointment when your surgery was scheduled.  If you did not receive a post-op/follow-up appointment, make sure that you call for this appointment within a day or two  after you arrive home to ensure a convenient appointment time.  WHEN TO CALL YOUR DOCTOR: Fever over 101.5 Inability to urinate Continued bleeding from incision. Increased pain, redness, or drainage from the incision. Increasing  abdominal pain  The clinic staff is available to answer your questions during regular business hours.  Please don't hesitate to call and ask to speak to one of the nurses for clinical concerns.  If you have a medical emergency, go to the nearest emergency room or call 911.  A surgeon from [facility] is always on call at the hospital. [address], [place], [place]  [address] ? [address] [address], [place], [place]   [address] [phone] ? [phone] ? FAX [phone] Web site: [email or web address]

## 2023-02-19 NOTE — H&P (Signed)
   Averyrose Mild is an 37 y.o. female.   HPI: 89F with UH. Plan for oUHR (primary) today. The patient has had no hospitalizations, doctors visits, ER visits, surgeries, or newly diagnosed allergies since being seen in the office.    Past Medical History:  Diagnosis Date   Anemia    Iron Deficiency during pregnancy   Anxiety    Depression    PCOS (polycystic ovarian syndrome)    Postpartum care following cesarean delivery (7/18) 01/18/2016   Ruptured ovarian cyst    Vaginal Pap smear, abnormal    Vaginal vestibulitis     Past Surgical History:  Procedure Laterality Date   CESAREAN SECTION N/A 01/18/2016   Procedure: CESAREAN SECTION;  Surgeon: Genia Del, MD;  Location: [facility];  Service: Obstetrics;  Laterality: N/A;   WISDOM TOOTH EXTRACTION      Family History  Problem Relation Age of Onset   Hypertension Mother    Hypertension Father    Diabetes Paternal Grandmother     Social History:  reports that she has never smoked. She has never used smokeless tobacco. She reports current alcohol use. She reports that she does not use drugs.  Allergies:  Allergies  Allergen Reactions   Robitussin Cough+Chest Cong Dm [Dextromethorphan-Guaifenesin] Nausea And Vomiting    Medications: I have reviewed the patient's current medications.  No results found for this or any previous visit (from the past 48 hour(s)).  No results found.  ROS 10 point review of systems is negative except as listed above in HPI.   Physical Exam Blood pressure 111/76, pulse 75, temperature 98 F (36.7 C), temperature source Oral, resp. rate 18, height 5\' 4"  (1.626 m), weight 97.3 kg, last menstrual period 01/12/2023, SpO2 96%. Constitutional: well-developed, well-nourished HEENT: pupils equal, round, reactive to light, 2mm b/l, moist conjunctiva, external inspection of ears and nose normal, hearing intact Oropharynx: normal oropharyngeal mucosa, normal dentition Neck: no  thyromegaly, trachea midline, no midline cervical tenderness to palpation Chest: breath sounds equal bilaterally, normal respiratory effort, no midline or lateral chest wall tenderness to palpation/deformity Abdomen: soft, NT, UH, no bruising, no hepatosplenomegaly Extremities: 2+ radial and pedal pulses bilaterally, intact motor and sensation bilateral UE and LE, no peripheral edema MSK: unable to assess gait/station, no clubbing/cyanosis of fingers/toes, normal ROM of all four extremities Skin: warm, dry, no rashes Psych: normal memory, normal mood/affect     Assessment/Plan: Umbilical hernia  UH - plan oUHR today. IInformed consent was obtained after detailed explanation of risks, including bleeding, infection, hematoma/seroma, temporary or permanent neuropathy, hernia recurrence, and mesh infection requiring explantation. All questions answered to the patient's satisfaction. FEN - strict NPO DVT - SCDs, LMWH Dispo -  home post-op     Diamantina Monks, MD General and Trauma Surgery [facility]

## 2023-02-19 NOTE — Op Note (Signed)
   Operative Note   Date: 02/19/2023  Procedure: open primary umbilical hernia repair  Pre-op diagnosis: umbilical hernia Post-op diagnosis: umbilical hernia, 1x1cm  Indication and clinical history: The patient is a 37 y.o. year old female with umbilical hernia     Surgeon: Diamantina Monks, MD  Anesthesiologist: Chaney Malling, MD Anesthesia: General  Findings:  Specimen: none EBL: <5cc Drains/Implants: none  Disposition: PACU - hemodynamically stable.  Description of procedure: The patient was positioned supine on the operating room table. General anesthetic induction and intubation were uneventful. Foley catheter insertion was performed and was atraumatic. Time-out was performed verifying correct patient, procedure, signature of informed consent, and administration of pre-operative antibiotics. The abdomen was prepped and draped in the usual sterile fashion.  An incision was made through the umbilicus until the hernia was reached. The defect measured approximately 1cm. The defect was primarily repaired using two #1 Noovfil figure-of-eight sutures. The subcutaneous dead space was obliterated with 3-0 vicryl suture. The skin was re-approximated with 4-0 monocryl. Dermabond was applied as sterile dressing.   All sponge and instrument counts were correct at the conclusion of the procedure. The patient was awakened from anesthesia, extubated uneventfully, and transported to the PACU in good condition. There were no complications.    Diamantina Monks, MD General and Trauma Surgery [facility]

## 2023-02-19 NOTE — Anesthesia Preprocedure Evaluation (Signed)
Anesthesia Evaluation  Patient identified by MRN, date of birth, ID band Patient awake    Reviewed: Allergy & Precautions, H&P , NPO status , Patient's Chart, lab work & pertinent test results  Airway Mallampati: II   Neck ROM: full    Dental   Pulmonary neg pulmonary ROS   breath sounds clear to auscultation       Cardiovascular negative cardio ROS  Rhythm:regular Rate:Normal     Neuro/Psych  PSYCHIATRIC DISORDERS Anxiety Depression       GI/Hepatic   Endo/Other    Renal/GU      Musculoskeletal   Abdominal   Peds  Hematology   Anesthesia Other Findings   Reproductive/Obstetrics PCOS                             Anesthesia Physical Anesthesia Plan  ASA: 2  Anesthesia Plan: General   Post-op Pain Management:    Induction: Intravenous  PONV Risk Score and Plan: 3 and Ondansetron, Dexamethasone, Midazolam and Treatment may vary due to age or medical condition  Airway Management Planned: Oral ETT  Additional Equipment:   Intra-op Plan:   Post-operative Plan: Extubation in OR  Informed Consent: I have reviewed the patients History and Physical, chart, labs and discussed the procedure including the risks, benefits and alternatives for the proposed anesthesia with the patient or authorized representative who has indicated his/her understanding and acceptance.     Dental advisory given  Plan Discussed with: CRNA, Anesthesiologist and Surgeon  Anesthesia Plan Comments:        Anesthesia Quick Evaluation

## 2023-02-19 NOTE — Anesthesia Procedure Notes (Signed)
Procedure Name: Intubation Date/Time: 02/19/2023 9:21 AM  Performed by: Ahmed Prima, CRNAPre-anesthesia Checklist: Patient identified, Emergency Drugs available, Suction available and Patient being monitored Patient Re-evaluated:Patient Re-evaluated prior to induction Oxygen Delivery Method: Circle system utilized Preoxygenation: Pre-oxygenation with 100% oxygen Induction Type: IV induction Ventilation: Mask ventilation without difficulty and Oral airway inserted - appropriate to patient size Laryngoscope Size: Mac and 3 Grade View: Grade I Tube type: Oral Tube size: 7.0 mm Number of attempts: 2 Airway Equipment and Method: Stylet and Oral airway Placement Confirmation: ETT inserted through vocal cords under direct vision, positive ETCO2 and breath sounds checked- equal and bilateral Secured at: 23 cm Tube secured with: Tape Dental Injury: Teeth and Oropharynx as per pre-operative assessment  Comments: 1st attempt by CRNA with Hyacinth Meeker 2 - grade 1 view, but unable to get ETT around blade in pt.'s mouth. Pt. Is an easy MBV with an OA #9 prior to and in between attempts. 2nd attempt successful by MD.

## 2023-02-20 ENCOUNTER — Encounter (HOSPITAL_COMMUNITY): Payer: Self-pay | Admitting: Surgery

## 2023-02-20 NOTE — Anesthesia Postprocedure Evaluation (Signed)
Anesthesia Post Note  Patient: Rachel [name]  Procedure(s) Performed: OPEN PRIMARY UMBILICAL HERNIA REPAIR (Abdomen)     Patient location during evaluation: PACU Anesthesia Type: General Level of consciousness: awake and alert Pain management: pain level controlled Vital Signs Assessment: post-procedure vital signs reviewed and stable Respiratory status: spontaneous breathing, nonlabored ventilation, respiratory function stable and patient connected to nasal cannula oxygen Cardiovascular status: blood pressure returned to baseline and stable Postop Assessment: no apparent nausea or vomiting Anesthetic complications: no   No notable events documented.  Last Vitals:  Vitals:   02/19/23 1115 02/19/23 1135  BP: 115/82 125/82  Pulse: 65 72  Resp: 13 15  Temp:  37 C  SpO2: 93% 93%    Last Pain:  Vitals:   02/19/23 1115  TempSrc:   PainSc: 0-No pain                 Marrietta Thunder S

## 2023-04-26 NOTE — Plan of Care (Signed)
[facility] Tonsillectomy/Adenoidectomy, Postoperative PEDS care plan entered in error.

## 2023-07-12 DIAGNOSIS — D649 Anemia, unspecified: Secondary | ICD-10-CM | POA: Diagnosis not present

## 2023-09-07 DIAGNOSIS — F4323 Adjustment disorder with mixed anxiety and depressed mood: Secondary | ICD-10-CM | POA: Diagnosis not present

## 2023-09-21 DIAGNOSIS — F4323 Adjustment disorder with mixed anxiety and depressed mood: Secondary | ICD-10-CM | POA: Diagnosis not present

## 2023-10-01 DIAGNOSIS — F4323 Adjustment disorder with mixed anxiety and depressed mood: Secondary | ICD-10-CM | POA: Diagnosis not present

## 2023-10-23 DIAGNOSIS — F4323 Adjustment disorder with mixed anxiety and depressed mood: Secondary | ICD-10-CM | POA: Diagnosis not present

## 2023-11-14 DIAGNOSIS — Z01419 Encounter for gynecological examination (general) (routine) without abnormal findings: Secondary | ICD-10-CM | POA: Diagnosis not present

## 2024-04-07 DIAGNOSIS — M7662 Achilles tendinitis, left leg: Secondary | ICD-10-CM | POA: Diagnosis not present

## 2024-04-07 DIAGNOSIS — E785 Hyperlipidemia, unspecified: Secondary | ICD-10-CM | POA: Diagnosis not present

## 2024-04-07 DIAGNOSIS — M25512 Pain in left shoulder: Secondary | ICD-10-CM | POA: Diagnosis not present

## 2024-04-07 DIAGNOSIS — F39 Unspecified mood [affective] disorder: Secondary | ICD-10-CM | POA: Diagnosis not present

## 2024-04-10 DIAGNOSIS — M79672 Pain in left foot: Secondary | ICD-10-CM | POA: Diagnosis not present

## 2024-04-10 DIAGNOSIS — M25512 Pain in left shoulder: Secondary | ICD-10-CM | POA: Diagnosis not present

## 2024-05-08 DIAGNOSIS — Z0000 Encounter for general adult medical examination without abnormal findings: Secondary | ICD-10-CM | POA: Diagnosis not present

## 2024-05-08 DIAGNOSIS — E785 Hyperlipidemia, unspecified: Secondary | ICD-10-CM | POA: Diagnosis not present

## 2024-05-08 DIAGNOSIS — D649 Anemia, unspecified: Secondary | ICD-10-CM | POA: Diagnosis not present

## 2024-05-12 DIAGNOSIS — F39 Unspecified mood [affective] disorder: Secondary | ICD-10-CM | POA: Diagnosis not present

## 2024-05-12 DIAGNOSIS — E785 Hyperlipidemia, unspecified: Secondary | ICD-10-CM | POA: Diagnosis not present

## 2024-05-12 DIAGNOSIS — Z23 Encounter for immunization: Secondary | ICD-10-CM | POA: Diagnosis not present

## 2024-05-12 DIAGNOSIS — Z0000 Encounter for general adult medical examination without abnormal findings: Secondary | ICD-10-CM | POA: Diagnosis not present

## 2024-05-12 DIAGNOSIS — H9190 Unspecified hearing loss, unspecified ear: Secondary | ICD-10-CM | POA: Diagnosis not present
